# Patient Record
Sex: Female | Born: 2000 | Race: Black or African American | Hispanic: No | Marital: Single | Smoking: Never smoker
Health system: Southern US, Community
[De-identification: ages and names within clinical notes are randomized; demographics above are authoritative.]

## PROBLEM LIST (undated history)

## (undated) HISTORY — PX: DILATION AND CURETTAGE OF UTERUS: SHX78

---
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
## 2018-11-07 DIAGNOSIS — Z113 Encounter for screening for infections with a predominantly sexual mode of transmission: Secondary | ICD-10-CM | POA: Diagnosis not present

## 2018-11-07 DIAGNOSIS — O26849 Uterine size-date discrepancy, unspecified trimester: Secondary | ICD-10-CM | POA: Diagnosis not present

## 2018-11-07 DIAGNOSIS — Z3480 Encounter for supervision of other normal pregnancy, unspecified trimester: Secondary | ICD-10-CM | POA: Diagnosis not present

## 2018-11-07 DIAGNOSIS — Z3490 Encounter for supervision of normal pregnancy, unspecified, unspecified trimester: Secondary | ICD-10-CM | POA: Diagnosis not present

## 2018-11-07 DIAGNOSIS — J398 Other specified diseases of upper respiratory tract: Secondary | ICD-10-CM | POA: Insufficient documentation

## 2018-11-12 DIAGNOSIS — Z3490 Encounter for supervision of normal pregnancy, unspecified, unspecified trimester: Secondary | ICD-10-CM | POA: Insufficient documentation

## 2018-11-14 DIAGNOSIS — Z1379 Encounter for other screening for genetic and chromosomal anomalies: Secondary | ICD-10-CM | POA: Diagnosis not present

## 2018-12-03 DIAGNOSIS — Z3480 Encounter for supervision of other normal pregnancy, unspecified trimester: Secondary | ICD-10-CM | POA: Diagnosis not present

## 2018-12-25 DIAGNOSIS — Z3480 Encounter for supervision of other normal pregnancy, unspecified trimester: Secondary | ICD-10-CM | POA: Diagnosis not present

## 2018-12-25 DIAGNOSIS — A5609 Other chlamydial infection of lower genitourinary tract: Secondary | ICD-10-CM | POA: Diagnosis not present

## 2019-01-07 DIAGNOSIS — Z363 Encounter for antenatal screening for malformations: Secondary | ICD-10-CM | POA: Diagnosis not present

## 2019-01-07 DIAGNOSIS — O280 Abnormal hematological finding on antenatal screening of mother: Secondary | ICD-10-CM | POA: Diagnosis not present

## 2019-01-07 DIAGNOSIS — O283 Abnormal ultrasonic finding on antenatal screening of mother: Secondary | ICD-10-CM | POA: Diagnosis not present

## 2019-01-07 DIAGNOSIS — Z3A18 18 weeks gestation of pregnancy: Secondary | ICD-10-CM | POA: Diagnosis not present

## 2019-01-07 DIAGNOSIS — O289 Unspecified abnormal findings on antenatal screening of mother: Secondary | ICD-10-CM | POA: Diagnosis not present

## 2019-01-07 DIAGNOSIS — Z36 Encounter for antenatal screening for chromosomal anomalies: Secondary | ICD-10-CM | POA: Diagnosis not present

## 2019-01-15 DIAGNOSIS — O3502X0 Maternal care for (suspected) central nervous system malformation or damage in fetus, anencephaly, not applicable or unspecified: Secondary | ICD-10-CM | POA: Insufficient documentation

## 2019-01-15 DIAGNOSIS — Z3A18 18 weeks gestation of pregnancy: Secondary | ICD-10-CM | POA: Diagnosis not present

## 2019-01-15 DIAGNOSIS — Z3009 Encounter for other general counseling and advice on contraception: Secondary | ICD-10-CM | POA: Diagnosis not present

## 2019-01-15 DIAGNOSIS — O350XX0 Maternal care for (suspected) central nervous system malformation in fetus, not applicable or unspecified: Secondary | ICD-10-CM | POA: Diagnosis not present

## 2019-01-15 DIAGNOSIS — Z3A19 19 weeks gestation of pregnancy: Secondary | ICD-10-CM | POA: Diagnosis not present

## 2019-01-17 DIAGNOSIS — Z01812 Encounter for preprocedural laboratory examination: Secondary | ICD-10-CM | POA: Diagnosis not present

## 2019-01-17 DIAGNOSIS — Z20828 Contact with and (suspected) exposure to other viral communicable diseases: Secondary | ICD-10-CM | POA: Diagnosis not present

## 2019-01-17 DIAGNOSIS — O350XX0 Maternal care for (suspected) central nervous system malformation in fetus, not applicable or unspecified: Secondary | ICD-10-CM | POA: Diagnosis not present

## 2019-01-17 DIAGNOSIS — Z3A20 20 weeks gestation of pregnancy: Secondary | ICD-10-CM | POA: Diagnosis not present

## 2019-01-20 DIAGNOSIS — O350XX0 Maternal care for (suspected) central nervous system malformation in fetus, not applicable or unspecified: Secondary | ICD-10-CM | POA: Diagnosis not present

## 2019-01-20 DIAGNOSIS — Z3A20 20 weeks gestation of pregnancy: Secondary | ICD-10-CM | POA: Diagnosis not present

## 2019-01-21 DIAGNOSIS — Z3A20 20 weeks gestation of pregnancy: Secondary | ICD-10-CM | POA: Diagnosis not present

## 2019-01-21 DIAGNOSIS — O350XX0 Maternal care for (suspected) central nervous system malformation in fetus, not applicable or unspecified: Secondary | ICD-10-CM | POA: Diagnosis not present

## 2019-01-21 DIAGNOSIS — Z9889 Other specified postprocedural states: Secondary | ICD-10-CM | POA: Insufficient documentation

## 2019-01-21 DIAGNOSIS — Z3A18 18 weeks gestation of pregnancy: Secondary | ICD-10-CM | POA: Diagnosis not present

## 2019-02-03 DIAGNOSIS — Z48816 Encounter for surgical aftercare following surgery on the genitourinary system: Secondary | ICD-10-CM | POA: Diagnosis not present

## 2019-05-02 ENCOUNTER — Other Ambulatory Visit: Payer: Self-pay

## 2019-05-02 ENCOUNTER — Ambulatory Visit (HOSPITAL_COMMUNITY)
Admission: EM | Admit: 2019-05-02 | Discharge: 2019-05-02 | Disposition: A | Discharge status: Home / Self Care | Payer: Medicaid Other | Attending: Family Medicine | Admitting: Family Medicine

## 2019-05-02 ENCOUNTER — Encounter (HOSPITAL_COMMUNITY): Payer: Self-pay

## 2019-05-02 DIAGNOSIS — Z3201 Encounter for pregnancy test, result positive: Secondary | ICD-10-CM

## 2019-05-02 DIAGNOSIS — Z3490 Encounter for supervision of normal pregnancy, unspecified, unspecified trimester: Secondary | ICD-10-CM

## 2019-05-02 LAB — POC URINE PREG, ED: Preg Test, Ur: POSITIVE — AB

## 2019-05-02 LAB — POCT PREGNANCY, URINE: Preg Test, Ur: POSITIVE — AB

## 2019-05-02 NOTE — ED Provider Notes (Signed)
[place]    CSN: [id number] Arrival date & time: 05/02/19  1051      History   Chief Complaint Chief Complaint  Patient presents with  . Possible Pregnancy    HPI Cheyenne Medina is a 18 y.o. female.   HPI  Patient is here for pregnancy test.  She had 3 pregnancy test at home that were positive.  She would like for [place] to confirm this for her.  She is asking for a beta hCG.  She is having no abdominal pain or bleeding.  She does have concerned because her first pregnancy ended with spontaneous bleeding/abortion. I explained to her that it was not medically necessary to do a beta hCG.  If her prior urine pregnancy test is positive she will be sent to [facility] and followed.  History reviewed. No pertinent past medical history.  There are no problems to display for this patient.   History reviewed. No pertinent surgical history.  OB History    Gravida  1   Para      Term      Preterm      AB  1   Living  0     SAB      TAB      Ectopic      Multiple      Live Births               Home Medications    Prior to Admission medications   Medication Sig Start Date End Date Taking? Authorizing Provider  Prenat-FeAsp-Meth-FA-DHA w/o A (PRENATE PIXIE) 10-0.6-0.4-200 MG CAPS SMARTSIG:1 Capsule(s) By Mouth Daily    [provider]    Family History History reviewed. No pertinent family history.  Social History Social History   Tobacco Use  . Smoking status: Never Smoker  . Smokeless tobacco: Never Used  Substance Use Topics  . Alcohol use: No  . Drug use: Not on file     Allergies   Patient has no known allergies.   Review of Systems Review of Systems  Constitutional: Negative for chills and fever.  HENT: Negative for congestion and hearing loss.   Eyes: Negative for pain.  Respiratory: Negative for cough and shortness of breath.   Cardiovascular: Negative for chest pain and leg swelling.  Gastrointestinal: Negative  for abdominal pain, constipation and diarrhea.  Genitourinary: Negative for dysuria and frequency.       Amenorrhea  Musculoskeletal: Negative for myalgias.  Neurological: Negative for dizziness, seizures and headaches.  Psychiatric/Behavioral: The patient is not nervous/anxious.      Physical Exam Triage Vital Signs ED Triage Vitals  Enc Vitals Group     BP 05/02/19 1101 113/77     Pulse Rate 05/02/19 1101 68     Resp 05/02/19 1101 14     Temp 05/02/19 1101 98.1 F (36.7 C)     Temp Source 05/02/19 1101 Oral     SpO2 05/02/19 1101 99 %     Weight --      Height --      Head Circumference --      Peak Flow --      Pain Score 05/02/19 1058 0     Pain Loc --      Pain Edu? --      Excl. in [place]? --    No data found.  Updated Vital Signs BP 113/77 (BP Location: Left Arm)   Pulse 68   Temp 98.1 F (36.7  C) (Oral)   Resp 14   LMP 04/09/2019   SpO2 99%     Physical Exam Constitutional:      General: She is not in acute distress.    Appearance: She is well-developed.  HENT:     Head: Normocephalic and atraumatic.  Eyes:     Conjunctiva/sclera: Conjunctivae normal.     Pupils: Pupils are equal, round, and reactive to light.  Cardiovascular:     Rate and Rhythm: Normal rate.  Pulmonary:     Effort: Pulmonary effort is normal. No respiratory distress.  Abdominal:     General: There is no distension.     Palpations: Abdomen is soft.  Musculoskeletal:        General: Normal range of motion.     Cervical back: Normal range of motion.  Skin:    General: Skin is warm and dry.  Neurological:     Mental Status: She is alert.  Psychiatric:        Mood and Affect: Mood normal.        Behavior: Behavior normal.      UC Treatments / Results  Labs (all labs ordered are listed, but only abnormal results are displayed) Labs Reviewed  POCT PREGNANCY, URINE - Abnormal; Notable for the following components:      Result Value   Preg Test, Ur POSITIVE (*)    All other  components within normal limits  POC URINE PREG, ED - Abnormal; Notable for the following components:   Preg Test, Ur POSITIVE (*)    All other components within normal limits    EKG   Radiology No results found.  Procedures Procedures (including critical care time)  Medications Ordered in UC Medications - No data to display  Initial Impression / Assessment and Plan / UC Course  I have reviewed the triage vital signs and the nursing notes.  Pertinent labs & imaging results that were available during my care of the patient were reviewed by me and considered in my medical decision making (see chart for details).      Final Clinical Impressions(s) / UC Diagnoses   Final diagnoses:  Pregnancy, unspecified gestational age     Discharge Instructions     You are pregnant Follow up with OB No alcohol or smoking Continue prenatal vitamin   ED Prescriptions    None     PDMP not reviewed this encounter.   Eustace Moore, MD 05/02/19 1154

## 2019-05-02 NOTE — Discharge Instructions (Addendum)
You are pregnant Follow up with OB No alcohol or smoking Continue prenatal vitamin

## 2019-05-02 NOTE — ED Triage Notes (Signed)
Pt presents to the UC for pregnancy test. Pt reports she has 3 positive pregnancy test at home yesterday.

## 2019-05-12 DIAGNOSIS — Z3009 Encounter for other general counseling and advice on contraception: Secondary | ICD-10-CM | POA: Diagnosis not present

## 2019-05-12 DIAGNOSIS — Z3200 Encounter for pregnancy test, result unknown: Secondary | ICD-10-CM | POA: Diagnosis not present

## 2019-05-23 ENCOUNTER — Encounter (HOSPITAL_COMMUNITY): Payer: Self-pay | Admitting: Obstetrics and Gynecology

## 2019-05-23 ENCOUNTER — Inpatient Hospital Stay (HOSPITAL_COMMUNITY)
Admission: EM | Admit: 2019-05-23 | Discharge: 2019-05-23 | Disposition: A | Discharge status: Home / Self Care | Payer: Medicaid Other | Attending: Obstetrics and Gynecology | Admitting: Obstetrics and Gynecology

## 2019-05-23 ENCOUNTER — Other Ambulatory Visit: Payer: Self-pay

## 2019-05-23 ENCOUNTER — Inpatient Hospital Stay (HOSPITAL_COMMUNITY): Payer: Medicaid Other

## 2019-05-23 DIAGNOSIS — O418X10 Other specified disorders of amniotic fluid and membranes, first trimester, not applicable or unspecified: Secondary | ICD-10-CM | POA: Diagnosis not present

## 2019-05-23 DIAGNOSIS — O4690 Antepartum hemorrhage, unspecified, unspecified trimester: Secondary | ICD-10-CM

## 2019-05-23 DIAGNOSIS — O219 Vomiting of pregnancy, unspecified: Secondary | ICD-10-CM

## 2019-05-23 DIAGNOSIS — N939 Abnormal uterine and vaginal bleeding, unspecified: Secondary | ICD-10-CM | POA: Diagnosis present

## 2019-05-23 DIAGNOSIS — O468X1 Other antepartum hemorrhage, first trimester: Secondary | ICD-10-CM | POA: Diagnosis not present

## 2019-05-23 DIAGNOSIS — Z3A01 Less than 8 weeks gestation of pregnancy: Secondary | ICD-10-CM | POA: Diagnosis not present

## 2019-05-23 DIAGNOSIS — Z6790 Unspecified blood type, Rh positive: Secondary | ICD-10-CM | POA: Diagnosis not present

## 2019-05-23 DIAGNOSIS — O208 Other hemorrhage in early pregnancy: Secondary | ICD-10-CM | POA: Diagnosis not present

## 2019-05-23 LAB — COMPREHENSIVE METABOLIC PANEL
ALT: 21 U/L (ref 0–44)
AST: 23 U/L (ref 15–41)
Albumin: 4.1 g/dL (ref 3.5–5.0)
Alkaline Phosphatase: 44 U/L (ref 38–126)
Anion gap: 8 (ref 5–15)
BUN: 7 mg/dL (ref 6–20)
CO2: 24 mmol/L (ref 22–32)
Calcium: 9.5 mg/dL (ref 8.9–10.3)
Chloride: 105 mmol/L (ref 98–111)
Creatinine, Ser: 0.54 mg/dL (ref 0.44–1.00)
GFR calc Af Amer: >60 mL/min (ref >60)
GFR calc non Af Amer: >60 mL/min (ref >60)
Glucose, Bld: 98 mg/dL (ref 70–99)
Potassium: 3.8 mmol/L (ref 3.5–5.1)
Sodium: 137 mmol/L (ref 135–145)
Total Bilirubin: 1.6 mg/dL — ABNORMAL HIGH (ref 0.3–1.2)
Total Protein: 6.7 g/dL (ref 6.5–8.1)

## 2019-05-23 LAB — URINALYSIS, ROUTINE W REFLEX MICROSCOPIC
Bilirubin Urine: NEGATIVE
Glucose, UA: NEGATIVE mg/dL
Ketones, ur: NEGATIVE mg/dL
Leukocytes,Ua: NEGATIVE
Nitrite: NEGATIVE
Protein, ur: 30 mg/dL — AB
Specific Gravity, Urine: 1.026 (ref 1.005–1.030)
pH: 8 (ref 5.0–8.0)

## 2019-05-23 LAB — HCG, QUANTITATIVE, PREGNANCY: hCG, Beta Chain, Quant, S: 99684 m[IU]/mL — ABNORMAL HIGH (ref <5)

## 2019-05-23 LAB — CBC
HCT: 33.6 % — ABNORMAL LOW (ref 36.0–46.0)
Hemoglobin: 11.2 g/dL — ABNORMAL LOW (ref 12.0–15.0)
MCH: 28.8 pg (ref 26.0–34.0)
MCHC: 33.3 g/dL (ref 30.0–36.0)
MCV: 86.4 fL (ref 80.0–100.0)
Platelets: 201 10*3/uL (ref 150–400)
RBC: 3.89 MIL/uL (ref 3.87–5.11)
RDW: 15.7 % — ABNORMAL HIGH (ref 11.5–15.5)
WBC: 6.2 10*3/uL (ref 4.0–10.5)
nRBC: 0 % (ref 0.0–0.2)

## 2019-05-23 LAB — WET PREP, GENITAL
Sperm: NONE SEEN
Trich, Wet Prep: NONE SEEN
Yeast Wet Prep HPF POC: NONE SEEN

## 2019-05-23 LAB — ABO/RH: ABO/RH(D): A POS

## 2019-05-23 NOTE — MAU Provider Note (Signed)
History     CSN: [id number]  Arrival date and time: 05/23/19 1504   First Provider Initiated Contact with Patient 05/23/19 1744      Chief Complaint  Patient presents with  . Vaginal Bleeding   Cheyenne Medina is a 19 y.o. G2P0010 at [redacted]w[redacted]d who presents to MAU for vaginal bleeding which began yesterday. Pt reports yesterday it was light and it got heavier today and appeared in her underwear. Pt reports it is lighter than her period.  Pt also reports constant nausea and vomiting x2 yesterday, but denies vomiting apart from this.  Passing blood clots? no Blood soaking clothes? no Lightheaded/dizzy? no Significant pelvic pain or cramping? No, pt denies any pain Passed any tissue? no  Current pregnancy problems? Pt has not yet been seen Blood Type? unknown Allergies? NKDA Current medications? PNVs Current PNC & next appt? Nestor Ramp, 05/30/2019  Pt denies vaginal discharge/odor/itching. Pt denies abdominal pain, constipation, diarrhea, or urinary problems. Pt denies fever, chills, fatigue, sweating or changes in appetite. Pt denies SOB or chest pain. Pt denies dizziness, HA, light-headedness, weakness.   OB History    Gravida  2   Para      Term      Preterm      AB  1   Living  0     SAB      TAB      Ectopic      Multiple      Live Births              History reviewed. No pertinent past medical history.  Past Surgical History:  Procedure Laterality Date  . DILATION AND CURETTAGE OF UTERUS      History reviewed. No pertinent family history.  Social History   Tobacco Use  . Smoking status: Never Smoker  . Smokeless tobacco: Never Used  Substance Use Topics  . Alcohol use: Never  . Drug use: Never    Allergies: No Known Allergies  Medications Prior to Admission  Medication Sig Dispense Refill Last Dose  . Prenat-FeAsp-Meth-FA-DHA w/o A (PRENATE PIXIE) 10-0.6-0.4-200 MG CAPS SMARTSIG:1 Capsule(s) By Mouth Daily   05/23/2019 at  Unknown time    Review of Systems  Constitutional: Negative for chills, diaphoresis, fatigue and fever.  Eyes: Negative for visual disturbance.  Respiratory: Negative for shortness of breath.   Cardiovascular: Negative for chest pain.  Gastrointestinal: Positive for nausea and vomiting. Negative for abdominal pain, constipation and diarrhea.  Genitourinary: Positive for vaginal bleeding. Negative for dysuria, flank pain, frequency, pelvic pain, urgency and vaginal discharge.  Neurological: Negative for dizziness, weakness, light-headedness and headaches.   Physical Exam   Blood pressure 132/68, pulse 74, temperature 98.8 F (37.1 C), resp. rate 16, height 5' (1.524 m), weight 52.2 kg, last menstrual period 04/09/2019, SpO2 100 %.  Physical Exam  Constitutional: She is oriented to person, place, and time. She appears well-developed and well-nourished. No distress.  HENT:  Head: Normocephalic and atraumatic.  Respiratory: Effort normal.  GI: Soft.  Neurological: She is alert and oriented to person, place, and time.  Skin: Skin is warm and dry. She is not diaphoretic.  Psychiatric: She has a normal mood and affect. Her behavior is normal. Judgment and thought content normal.  Pt declines pelvic exam, blind swabs collected by RN.  Results for orders placed or performed during the hospital encounter of 05/23/19 (from the past 24 hour(s))  Urinalysis, Routine w reflex microscopic     Status:  Abnormal   Collection Time: 05/23/19  5:10 PM  Result Value Ref Range   Color, Urine YELLOW YELLOW   APPearance HAZY (A) CLEAR   Specific Gravity, Urine 1.026 1.005 - 1.030   pH 8.0 5.0 - 8.0   Glucose, UA NEGATIVE NEGATIVE mg/dL   Hgb urine dipstick MODERATE (A) NEGATIVE   Bilirubin Urine NEGATIVE NEGATIVE   Ketones, ur NEGATIVE NEGATIVE mg/dL   Protein, ur 30 (A) NEGATIVE mg/dL   Nitrite NEGATIVE NEGATIVE   Leukocytes,Ua NEGATIVE NEGATIVE   RBC / HPF 0-5 0 - 5 RBC/hpf   WBC, UA 0-5 0 - 5  WBC/hpf   Bacteria, UA FEW (A) NONE SEEN   Squamous Epithelial / LPF 11-20 0 - 5   Mucus PRESENT   CBC     Status: Abnormal   Collection Time: 05/23/19  5:48 PM  Result Value Ref Range   WBC 6.2 4.0 - 10.5 K/uL   RBC 3.89 3.87 - 5.11 MIL/uL   Hemoglobin 11.2 (L) 12.0 - 15.0 g/dL   HCT 16.1 (L) 09.6 - 04.5 %   MCV 86.4 80.0 - 100.0 fL   MCH 28.8 26.0 - 34.0 pg   MCHC 33.3 30.0 - 36.0 g/dL   RDW 40.9 (H) 81.1 - 91.4 %   Platelets 201 150 - 400 K/uL   nRBC 0.0 0.0 - 0.2 %  Comprehensive metabolic panel     Status: Abnormal   Collection Time: 05/23/19  5:48 PM  Result Value Ref Range   Sodium 137 135 - 145 mmol/L   Potassium 3.8 3.5 - 5.1 mmol/L   Chloride 105 98 - 111 mmol/L   CO2 24 22 - 32 mmol/L   Glucose, Bld 98 70 - 99 mg/dL   BUN 7 6 - 20 mg/dL   Creatinine, Ser 7.82 0.44 - 1.00 mg/dL   Calcium 9.5 8.9 - 95.6 mg/dL   Total Protein 6.7 6.5 - 8.1 g/dL   Albumin 4.1 3.5 - 5.0 g/dL   AST 23 15 - 41 U/L   ALT 21 0 - 44 U/L   Alkaline Phosphatase 44 38 - 126 U/L   Total Bilirubin 1.6 (H) 0.3 - 1.2 mg/dL   GFR calc non Af Amer >60 >60 mL/min   GFR calc Af Amer >60 >60 mL/min   Anion gap 8 5 - 15  hCG, quantitative, pregnancy     Status: Abnormal   Collection Time: 05/23/19  5:48 PM  Result Value Ref Range   hCG, Beta Chain, Quant, S 99,684 (H) <5 mIU/mL  ABO/Rh     Status: None   Collection Time: 05/23/19  5:48 PM  Result Value Ref Range   ABO/RH(D)      A POS Performed at [facility], [address] [place], [place] [address]   Wet prep, genital     Status: Abnormal   Collection Time: 05/23/19  6:00 PM  Result Value Ref Range   Yeast Wet Prep HPF POC NONE SEEN NONE SEEN   Trich, Wet Prep NONE SEEN NONE SEEN   Clue Cells Wet Prep HPF POC PRESENT (A) NONE SEEN   WBC, Wet Prep HPF POC MODERATE (A) NONE SEEN   Sperm NONE SEEN    US OB Comp Less 14 Wks  Result Date: 05/23/2019 CLINICAL DATA:  Bleeding abdominal pain EXAM: OBSTETRIC <14 WK ULTRASOUND  TECHNIQUE: Transabdominal ultrasound was performed for evaluation of the gestation as well as the maternal uterus and adnexal regions. COMPARISON:  None.  FINDINGS: Intrauterine gestational sac: Single Yolk sac:  Visualized. Embryo:  Visualized. Cardiac Activity: Visualized. Heart Rate: 117 bpm CRL: 5.8 mm   6 w 2 d                  [place] EDC: 01/14/2020 Subchorionic hemorrhage: There is a moderate volume of subchorionic hemorrhage Maternal uterus/adnexae: The ovaries are unremarkable. There is no significant free fluid in the pelvis. IMPRESSION: Single live IUP at 6 weeks and 2 days as above. There is a moderate volume of subchorionic hemorrhage. Electronically Signed   By: Constance Holster M.D.   On: 05/23/2019 18:29   MAU Course  Procedures  MDM -N/V, constant nausea, vomiting x2, will discuss OTC meds -r/o ectopic -UA: hazy/mod hgb/30PRO/few bacteria, sending urine for culture -CBC: WNL for pregnancy -CMP: WNL -[place]: single IUP, +FHR 117, [redacted]w[redacted]d, moderate subchorionic hemorrhage -hCG: 25,366 -ABO: A Positive -WetPrep: +ClueCells, isolated finding not requiring treatment -GC/CT collected -pt discharged to home in stable condition  Orders Placed This Encounter  Procedures  . Wet prep, genital    Standing Status:   Standing    Number of Occurrences:   1  . Culture, OB Urine    Standing Status:   Standing    Number of Occurrences:   1  . US OB Comp Less 14 Wks    Standing Status:   Standing    Number of Occurrences:   1    Order Specific Question:   Symptom/Reason for Exam    Answer:   Vaginal bleeding in pregnancy [[id number]]  . Urinalysis, Routine w reflex microscopic    Standing Status:   Standing    Number of Occurrences:   1  . CBC    Standing Status:   Standing    Number of Occurrences:   1  . Comprehensive metabolic panel    Standing Status:   Standing    Number of Occurrences:   1  . hCG, quantitative, pregnancy    Standing Status:   Standing    Number of Occurrences:   1  .  ABO/Rh    Standing Status:   Standing    Number of Occurrences:   1  . Discharge patient    Order Specific Question:   Discharge disposition    Answer:   01-Home or Self Care [1]    Order Specific Question:   Discharge patient date    Answer:   05/23/2019   Assessment and Plan   1. Subchorionic hemorrhage of placenta in first trimester, single or unspecified fetus   2. Vaginal bleeding in pregnancy   3. Blood type, Rh positive   4. [redacted] weeks gestation of pregnancy   5. Nausea and vomiting in pregnancy    Allergies as of 05/23/2019   No Known Allergies     Medication List    TAKE these medications   Prenate Pixie 10-0.6-0.4-200 MG Caps SMARTSIG:1 Capsule(s) By Mouth Daily      -will call with culture results, if positive -safe meds in pregnancy list given with focus on meds for N/V, VitB6 and Unisom recommended -discussed implications of subchorionic hemorrhage, normal expectations and when to return to MAU -discussed nonpharmacologic and pharmacologic treatments of N/V -discussed normal expectations for N/V in pregnancy -strict bleeding/pain/hyperemesis/return MAU precautions given -pt discharged to home in stable condition  Elmyra Ricks E Lemar Bakos 05/23/2019, 7:05 PM

## 2019-05-23 NOTE — MAU Note (Signed)
.   Cheyenne Medina is a 19 y.o. at [redacted]w[redacted]d here in MAU reporting: vaginal bleeding x 2 days  Denies any pain. LMP: 04/09/19 Onset of complaint: 2 days Pain score: 0 Vitals:   05/23/19 1508 05/23/19 1647  BP: 132/77 132/68  Pulse: 81 74  Resp: 16 16  Temp: 98.8 F (37.1 C) 98.8 F (37.1 C)  SpO2: 100% 100%     FHT: Lab orders placed from triage  UA

## 2019-05-23 NOTE — L&D Delivery Note (Signed)
Delivery Note Patient pushed well for 20 minutes.   At 3:48 PM a viable female was delivered via  (Presentation:   Occiput Anterior).  There was a tight nuchal cord that was unable to be reduced prior to delivery.  APGAR: 5, 9; weight 5 lb 12.1 oz (2610 g).   Placenta status: Spontaneous, Intact.  Cord: 3 vessels with the following complications: None.  Cord pH: n/a  Anesthesia: Epidural Episiotomy: None Lacerations: None Suture Repair: n/a Est. Blood Loss (mL): 50  Mom to postpartum.  Baby to Couplet care / Skin to Skin.  Conya Ellinwood GEFFEL Caron Ode 12/22/2019, 4:19 PM

## 2019-05-23 NOTE — Discharge Instructions (Signed)
Miscarriage A miscarriage is the loss of an unborn baby (fetus) before the 20th week of pregnancy. Most miscarriages happen during the first 3 months of pregnancy. Sometimes, a miscarriage can happen before a woman knows that she is pregnant. Having a miscarriage can be an emotional experience. If you have had a miscarriage, talk with your health care provider about any questions you may have about miscarrying, the grieving process, and your plans for future pregnancy. What are the causes? A miscarriage may be caused by:  Problems with the genes or chromosomes of the fetus. These problems make it impossible for the baby to develop normally. They are often the result of random errors that occur early in the development of the baby, and are not passed from parent to child (not inherited).  Infection of the cervix or uterus.  Conditions that affect hormone balance in the body.  Problems with the cervix, such as the cervix opening and thinning before pregnancy is at term (cervical insufficiency).  Problems with the uterus. These may include: ? A uterus with an abnormal shape. ? Fibroids in the uterus. ? Congenital abnormalities. These are problems that were present at birth.  Certain medical conditions.  Smoking, drinking alcohol, or using drugs.  Injury (trauma). In many cases, the cause of a miscarriage is not known. What are the signs or symptoms? Symptoms of this condition include:  Vaginal bleeding or spotting, with or without cramps or pain.  Pain or cramping in the abdomen or lower back.  Passing fluid, tissue, or blood clots from the vagina. How is this diagnosed? This condition may be diagnosed based on:  A physical exam.  Ultrasound.  Blood tests.  Urine tests. How is this treated? Treatment for a miscarriage is sometimes not necessary if you naturally pass all the tissue that was in your uterus. If necessary, this condition may be treated with:  Dilation and  curettage (D&C). This is a procedure in which the cervix is stretched open and the lining of the uterus (endometrium) is scraped. This is done only if tissue from the fetus or placenta remains in the body (incomplete miscarriage).  Medicines, such as: ? Antibiotic medicine, to treat infection. ? Medicine to help the body pass any remaining tissue. ? Medicine to reduce (contract) the size of the uterus. These medicines may be given if you have a lot of bleeding. If you have Rh negative blood and your baby was Rh positive, you will need a shot of a medicine called Rh immunoglobulinto protect your future babies from Rh blood problems. "Rh-negative" and "Rh-positive" refer to whether or not the blood has a specific protein found on the surface of red blood cells (Rh factor). Follow these instructions at home: Medicines   Take over-the-counter and prescription medicines only as told by your health care provider.  If you were prescribed antibiotic medicine, take it as told by your health care provider. Do not stop taking the antibiotic even if you start to feel better.  Do not take NSAIDs, such as aspirin and ibuprofen, unless they are approved by your health care provider. These medicines can cause bleeding. Activity  Rest as directed. Ask your health care provider what activities are safe for you.  Have someone help with home and family responsibilities during this time. General instructions  Keep track of the number of sanitary pads you use each day and how soaked (saturated) they are. Write down this information.  Monitor the amount of tissue or blood clots that   you pass from your vagina. Save any large amounts of tissue for your health care provider to examine.  Do not use tampons, douche, or have sex until your health care provider approves.  To help you and your partner with the process of grieving, talk with your health care provider or seek counseling.  When you are ready, meet with  your health care provider to discuss any important steps you should take for your health. Also, discuss steps you should take to have a healthy pregnancy in the future.  Keep all follow-up visits as told by your health care provider. This is important. Where to find more information  The American Congress of Obstetricians and Gynecologists: www.acog.org  [place] Department of Health and Cytogeneticist of Women's Health: http://hoffman.com/ Contact a health care provider if:  You have a fever or chills.  You have a foul smelling vaginal discharge.  You have more bleeding instead of less. Get help right away if:  You have severe cramps or pain in your back or abdomen.  You pass blood clots or tissue from your vagina that is walnut-sized or larger.  You soak more than 1 regular sanitary pad in an hour.  You become light-headed or weak.  You pass out.  You have feelings of sadness that take over your thoughts, or you have thoughts of hurting yourself. Summary  Most miscarriages happen in the first 3 months of pregnancy. Sometimes miscarriage happens before a woman even knows that she is pregnant.  Follow your health care provider's instruction for home care. Keep all follow-up appointments.  To help you and your partner with the process of grieving, talk with your health care provider or seek counseling. This information is not intended to replace advice given to you by your health care provider. Make sure you discuss any questions you have with your health care provider. Document Revised: 08/30/2018 Document Reviewed: 06/13/2016 Elsevier Patient Education  2020 [employer].                  Safe Medications in Pregnancy    Acne: Benzoyl Peroxide Salicylic Acid  Backache/Headache: Tylenol: 2 regular strength every 4 hours OR              2 Extra strength every 6 hours  Colds/Coughs/Allergies: Benadryl (alcohol free) 25 mg every 6 hours as needed Breath right  strips Claritin Cepacol throat lozenges Chloraseptic throat spray Cold-Eeze- up to three times per day Cough drops, alcohol free Flonase (by prescription only) Guaifenesin Mucinex Robitussin DM (plain only, alcohol free) Saline nasal spray/drops Sudafed (pseudoephedrine) & Actifed ** use only after [redacted] weeks gestation and if you do not have high blood pressure Tylenol Vicks Vaporub Zinc lozenges Zyrtec   Constipation: Colace Ducolax suppositories Fleet enema Glycerin suppositories Metamucil Milk of magnesia Miralax Senokot Smooth move tea  Diarrhea: Kaopectate Imodium A-D  *NO pepto Bismol  Hemorrhoids: Anusol Anusol HC Preparation H Tucks  Indigestion: Tums Maalox Mylanta Zantac  Pepcid  Insomnia: Benadryl (alcohol free) 25mg  every 6 hours as needed Tylenol PM Unisom, no Gelcaps  Leg Cramps: Tums MagGel  Nausea/Vomiting:  Bonine Dramamine Emetrol Ginger extract Sea bands Meclizine  Nausea medication to take during pregnancy:  Unisom (doxylamine succinate 25 mg tablets) Take one tablet daily at bedtime. If symptoms are not adequately controlled, the dose can be increased to a maximum recommended dose of two tablets daily (1/2 tablet in the morning, 1/2 tablet mid-afternoon and one at bedtime). Vitamin B6 100mg  tablets.  Take one tablet twice a day (up to 200 mg per day).  Skin Rashes: Aveeno products Benadryl cream or 25mg  every 6 hours as needed Calamine Lotion 1% cortisone cream  Yeast infection: Gyne-lotrimin 7 Monistat 7   **If taking multiple medications, please check labels to avoid duplicating the same active ingredients **take medication as directed on the label ** Do not exceed 4000 mg of tylenol in 24 hours **Do not take medications that contain aspirin or ibuprofen    Hyperemesis Gravidarum Hyperemesis gravidarum is a severe form of nausea and vomiting that happens during pregnancy. Hyperemesis is worse than morning  sickness. It may cause you to have nausea or vomiting all day for many days. It may keep you from eating and drinking enough food and liquids, which can lead to dehydration, malnutrition, and weight loss. Hyperemesis usually occurs during the first half (the first 20 weeks) of pregnancy. It often goes away once a woman is in her second half of pregnancy. However, sometimes hyperemesis continues through an entire pregnancy. What are the causes? The cause of this condition is not known. It may be related to changes in chemicals (hormones) in the body during pregnancy, such as the high level of pregnancy hormone (human chorionic gonadotropin) or the increase in the female sex hormone (estrogen). What are the signs or symptoms? Symptoms of this condition include:  Nausea that does not go away.  Vomiting that does not allow you to keep any food down.  Weight loss.  Body fluid loss (dehydration).  Having no desire to eat, or not liking food that you have previously enjoyed. How is this diagnosed? This condition may be diagnosed based on:  A physical exam.  Your medical history.  Your symptoms.  Blood tests.  Urine tests. How is this treated? This condition is managed by controlling symptoms. This may include:  Following an eating plan. This can help lessen nausea and vomiting.  Taking prescription medicines. An eating plan and medicines are often used together to help control symptoms. If medicines do not help relieve nausea and vomiting, you may need to receive fluids through an IV at the hospital. Follow these instructions at home: Eating and drinking   Avoid the following: ? Drinking fluids with meals. Try not to drink anything during the 30 minutes before and after your meals. ? Drinking more than 1 cup of fluid at a time. ? Eating foods that trigger your symptoms. These may include spicy foods, coffee, high-fat foods, very sweet foods, and acidic foods. ? Skipping meals.  Nausea can be more intense on an empty stomach. If you cannot tolerate food, do not force it. Try sucking on ice chips or other frozen items and make up for missed calories later. ? Lying down within 2 hours after eating. ? Being exposed to environmental triggers. These may include food smells, smoky rooms, closed spaces, rooms with strong smells, warm or humid places, overly loud and noisy rooms, and rooms with motion or flickering lights. Try eating meals in a well-ventilated area that is free of strong smells. ? Quick and sudden changes in your movement. ? Taking iron pills and multivitamins that contain iron. If you take prescription iron pills, do not stop taking them unless your health care provider approves. ? Preparing food. The smell of food can spoil your appetite or trigger nausea.  To help relieve your symptoms: ? Listen to your body. Everyone is different and has different preferences. Find what works best for you. ? Eat  and drink slowly. ? Eat 5-6 small meals daily instead of 3 large meals. Eating small meals and snacks can help you avoid an empty stomach. ? In the morning, before getting out of bed, eat a couple of crackers to avoid moving around on an empty stomach. ? Try eating starchy foods as these are usually tolerated well. Examples include cereal, toast, bread, potatoes, pasta, rice, and pretzels. ? Include at least 1 serving of protein with your meals and snacks. Protein options include lean meats, poultry, seafood, beans, nuts, nut butters, eggs, cheese, and yogurt. ? Try eating a protein-rich snack before bed. Examples of a protein-rick snack include cheese and crackers or a peanut butter sandwich made with 1 slice of whole-wheat bread and 1 tsp (5 g) of peanut butter. ? Eat or suck on things that have ginger in them. It may help relieve nausea. Add  tsp ground ginger to hot tea or choose ginger tea. ? Try drinking 100% fruit juice or an electrolyte drink. An electrolyte  drink contains sodium, potassium, and chloride. ? Drink fluids that are cold, clear, and carbonated or sour. Examples include lemonade, ginger ale, lemon-lime soda, ice water, and sparkling water. ? Brush your teeth or use a mouth rinse after meals. ? Talk with your health care provider about starting a supplement of vitamin B6. General instructions  Take over-the-counter and prescription medicines only as told by your health care provider.  Follow instructions from your health care provider about eating or drinking restrictions.  Continue to take your prenatal vitamins as told by your health care provider. If you are having trouble taking your prenatal vitamins, talk with your health care provider about different options.  Keep all follow-up and pre-birth (prenatal) visits as told by your health care provider. This is important. Contact a health care provider if:  You have pain in your abdomen.  You have a severe headache.  You have vision problems.  You are losing weight.  You feel weak or dizzy. Get help right away if:  You cannot drink fluids without vomiting.  You vomit blood.  You have constant nausea and vomiting.  You are very weak.  You faint.  You have a fever and your symptoms suddenly get worse. Summary  Hyperemesis gravidarum is a severe form of nausea and vomiting that happens during pregnancy.  Making some changes to your eating habits may help relieve nausea and vomiting.  This condition may be managed with medicine.  If medicines do not help relieve nausea and vomiting, you may need to receive fluids through an IV at the hospital. This information is not intended to replace advice given to you by your health care provider. Make sure you discuss any questions you have with your health care provider. Document Revised: 05/28/2017 Document Reviewed: 01/05/2016 Elsevier Patient Education  [place]. Morning Sickness  Morning sickness is when a  woman feels nauseous during pregnancy. This nauseous feeling may or may not come with vomiting. It often occurs in the morning, but it can be a problem at any time of day. Morning sickness is most common during the first trimester. In some cases, it may continue throughout pregnancy. Although morning sickness is unpleasant, it is usually harmless unless the woman develops severe and continual vomiting (hyperemesis gravidarum), a condition that requires more intense treatment. What are the causes? The exact cause of this condition is not known, but it seems to be related to normal hormonal changes that occur in pregnancy. What increases  the risk? You are more likely to develop this condition if:  You experienced nausea or vomiting before your pregnancy.  You had morning sickness during a previous pregnancy.  You are pregnant with more than one baby, such as twins. What are the signs or symptoms? Symptoms of this condition include:  Nausea.  Vomiting. How is this diagnosed? This condition is usually diagnosed based on your signs and symptoms. How is this treated? In many cases, treatment is not needed for this condition. Making some changes to what you eat may help to control symptoms. Your health care provider may also prescribe or recommend:  Vitamin B6 supplements.  Anti-nausea medicines.  Ginger. Follow these instructions at home: Medicines  Take over-the-counter and prescription medicines only as told by your health care provider. Do not use any prescription, over-the-counter, or herbal medicines for morning sickness without first talking with your health care provider.  Taking multivitamins before getting pregnant can prevent or decrease the severity of morning sickness in most women. Eating and drinking  Eat a piece of dry toast or crackers before getting out of bed in the morning.  Eat 5 or 6 small meals a day.  Eat dry and bland foods, such as rice or a baked potato.  Foods that are high in carbohydrates are often helpful.  Avoid greasy, fatty, and spicy foods.  Have someone cook for you if the smell of any food causes nausea and vomiting.  If you feel nauseous after taking prenatal vitamins, take the vitamins at night or with a snack.  Snack on protein foods between meals if you are hungry. Nuts, yogurt, and cheese are good options.  Drink fluids throughout the day.  Try ginger ale made with real ginger, ginger tea made from fresh grated ginger, or ginger candies. General instructions  Do not use any products that contain nicotine or tobacco, such as cigarettes and e-cigarettes. If you need help quitting, ask your health care provider.  Get an air purifier to keep the air in your house free of odors.  Get plenty of fresh air.  Try to avoid odors that trigger your nausea.  Consider trying these methods to help relieve symptoms: ? Wearing an acupressure wristband. These wristbands are often worn for seasickness. ? Acupuncture. Contact a health care provider if:  Your home remedies are not working and you need medicine.  You feel dizzy or light-headed.  You are losing weight. Get help right away if:  You have persistent and uncontrolled nausea and vomiting.  You faint.  You have severe pain in your abdomen. Summary  Morning sickness is when a woman feels nauseous during pregnancy. This nauseous feeling may or may not come with vomiting.  Morning sickness is most common during the first trimester.  It often occurs in the morning, but it can be a problem at any time of day.  In many cases, treatment is not needed for this condition. Making some changes to what you eat may help to control symptoms. This information is not intended to replace advice given to you by your health care provider. Make sure you discuss any questions you have with your health care provider. Document Revised: 04/20/2017 Document Reviewed: 06/10/2016 Elsevier  Patient Education  2020 [employer] Subchorionic Hematoma  A subchorionic hematoma is a gathering of blood between the outer wall of the embryo (chorion) and the inner wall of the womb (uterus). This condition can cause vaginal bleeding. If they cause little or no vaginal bleeding, early  small hematomas usually shrink on their own and do not affect your baby or pregnancy. When bleeding starts later in pregnancy, or if the hematoma is larger or occurs in older pregnant women, the condition may be more serious. Larger hematomas may get bigger, which increases the chances of miscarriage. This condition also increases the risk of:  Premature separation of the placenta from the uterus.  Premature (preterm) labor.  Stillbirth. What are the causes? The exact cause of this condition is not known. It occurs when blood is trapped between the placenta and the uterine wall because the placenta has separated from the original site of implantation. What increases the risk? You are more likely to develop this condition if:  You were treated with fertility medicines.  You conceived through in vitro fertilization (IVF). What are the signs or symptoms? Symptoms of this condition include:  Vaginal spotting or bleeding.  Contractions of the uterus. These cause abdominal pain. Sometimes you may have no symptoms and the bleeding may only be seen when ultrasound images are taken (transvaginal ultrasound). How is this diagnosed? This condition is diagnosed based on a physical exam. This includes a pelvic exam. You may also have other tests, including:  Blood tests.  Urine tests.  Ultrasound of the abdomen. How is this treated? Treatment for this condition can vary. Treatment may include:  Watchful waiting. You will be monitored closely for any changes in bleeding. During this stage: ? The hematoma may be reabsorbed by the body. ? The hematoma may separate the fluid-filled space containing the  embryo (gestational sac) from the wall of the womb (endometrium).  Medicines.  Activity restriction. This may be needed until the bleeding stops. Follow these instructions at home:  Stay on bed rest if told to do so by your health care provider.  Do not lift anything that is heavier than 10 lbs. (4.5 kg) or as told by your health care provider.  Do not use any products that contain nicotine or tobacco, such as cigarettes and e-cigarettes. If you need help quitting, ask your health care provider.  Track and write down the number of pads you use each day and how soaked (saturated) they are.  Do not use tampons.  Keep all follow-up visits as told by your health care provider. This is important. Your health care provider may ask you to have follow-up blood tests or ultrasound tests or both. Contact a health care provider if:  You have any vaginal bleeding.  You have a fever. Get help right away if:  You have severe cramps in your stomach, back, abdomen, or pelvis.  You pass large clots or tissue. Save any tissue for your health care provider to look at.  You have more vaginal bleeding, and you faint or become lightheaded or weak. Summary  A subchorionic hematoma is a gathering of blood between the outer wall of the placenta and the uterus.  This condition can cause vaginal bleeding.  Sometimes you may have no symptoms and the bleeding may only be seen when ultrasound images are taken.  Treatment may include watchful waiting, medicines, or activity restriction. This information is not intended to replace advice given to you by your health care provider. Make sure you discuss any questions you have with your health care provider. Document Revised: 04/20/2017 Document Reviewed: 07/04/2016 Elsevier Patient Education  2020 [employer] Vaginal Bleeding During Pregnancy, First Trimester  A small amount of bleeding from the vagina (spotting) is relatively common during early  pregnancy.  It usually stops on its own. Various things may cause bleeding or spotting during early pregnancy. Some bleeding may be related to the pregnancy, and some may not. In many cases, the bleeding is normal and is not a problem. However, bleeding can also be a sign of something serious. Be sure to tell your health care provider about any vaginal bleeding right away. Some possible causes of vaginal bleeding during the first trimester include:  Infection or inflammation of the cervix.  Growths (polyps) on the cervix.  Miscarriage or threatened miscarriage.  Pregnancy tissue developing outside of the uterus (ectopic pregnancy).  A mass of tissue developing in the uterus due to an egg being fertilized incorrectly (molar pregnancy). Follow these instructions at home: Activity  Follow instructions from your health care provider about limiting your activity. Ask what activities are safe for you.  If needed, make plans for someone to help with your regular activities.  Do not have sex or orgasms until your health care provider says that this is safe. General instructions  Take over-the-counter and prescription medicines only as told by your health care provider.  Pay attention to any changes in your symptoms.  Do not use tampons or douche.  Write down how many pads you use each day, how often you change pads, and how soaked (saturated) they are.  If you pass any tissue from your vagina, save the tissue so you can show it to your health care provider.  Keep all follow-up visits as told by your health care provider. This is important. Contact a health care provider if:  You have vaginal bleeding during any part of your pregnancy.  You have cramps or labor pains.  You have a fever. Get help right away if:  You have severe cramps in your back or abdomen.  You pass large clots or a large amount of tissue from your vagina.  Your bleeding increases.  You feel light-headed or  weak, or you faint.  You have chills.  You are leaking fluid or have a gush of fluid from your vagina. Summary  A small amount of bleeding (spotting) from the vagina is relatively common during early pregnancy.  Various things may cause bleeding or spotting in early pregnancy.  Be sure to tell your health care provider about any vaginal bleeding right away. This information is not intended to replace advice given to you by your health care provider. Make sure you discuss any questions you have with your health care provider. Document Revised: 08/27/2018 Document Reviewed: 08/10/2016 Elsevier Patient Education  2020 [employer].

## 2019-05-24 LAB — CULTURE, OB URINE: Culture: NO GROWTH

## 2019-05-26 LAB — GC/CHLAMYDIA PROBE AMP (~~LOC~~) NOT AT ARMC
Chlamydia: NEGATIVE
Comment: NEGATIVE
Comment: NORMAL
Neisseria Gonorrhea: NEGATIVE

## 2019-06-03 DIAGNOSIS — O26849 Uterine size-date discrepancy, unspecified trimester: Secondary | ICD-10-CM | POA: Diagnosis not present

## 2019-06-03 DIAGNOSIS — Z113 Encounter for screening for infections with a predominantly sexual mode of transmission: Secondary | ICD-10-CM | POA: Diagnosis not present

## 2019-06-18 DIAGNOSIS — Z3480 Encounter for supervision of other normal pregnancy, unspecified trimester: Secondary | ICD-10-CM | POA: Diagnosis not present

## 2019-06-19 DIAGNOSIS — Z3A10 10 weeks gestation of pregnancy: Secondary | ICD-10-CM | POA: Diagnosis not present

## 2019-06-19 DIAGNOSIS — O352XX0 Maternal care for (suspected) hereditary disease in fetus, not applicable or unspecified: Secondary | ICD-10-CM | POA: Diagnosis not present

## 2019-07-07 DIAGNOSIS — Z3A12 12 weeks gestation of pregnancy: Secondary | ICD-10-CM | POA: Diagnosis not present

## 2019-07-07 DIAGNOSIS — O352XX0 Maternal care for (suspected) hereditary disease in fetus, not applicable or unspecified: Secondary | ICD-10-CM | POA: Diagnosis not present

## 2019-07-07 DIAGNOSIS — Z3689 Encounter for other specified antenatal screening: Secondary | ICD-10-CM | POA: Diagnosis not present

## 2019-07-30 DIAGNOSIS — Z3482 Encounter for supervision of other normal pregnancy, second trimester: Secondary | ICD-10-CM | POA: Diagnosis not present

## 2019-08-18 DIAGNOSIS — Z363 Encounter for antenatal screening for malformations: Secondary | ICD-10-CM | POA: Diagnosis not present

## 2019-08-18 DIAGNOSIS — O09292 Supervision of pregnancy with other poor reproductive or obstetric history, second trimester: Secondary | ICD-10-CM | POA: Diagnosis not present

## 2019-08-18 DIAGNOSIS — Z36 Encounter for antenatal screening for chromosomal anomalies: Secondary | ICD-10-CM | POA: Diagnosis not present

## 2019-08-18 DIAGNOSIS — Z3A18 18 weeks gestation of pregnancy: Secondary | ICD-10-CM | POA: Diagnosis not present

## 2019-10-24 DIAGNOSIS — Z23 Encounter for immunization: Secondary | ICD-10-CM | POA: Diagnosis not present

## 2019-10-24 DIAGNOSIS — Z3480 Encounter for supervision of other normal pregnancy, unspecified trimester: Secondary | ICD-10-CM | POA: Diagnosis not present

## 2019-10-24 DIAGNOSIS — O99019 Anemia complicating pregnancy, unspecified trimester: Secondary | ICD-10-CM | POA: Diagnosis not present

## 2019-11-10 ENCOUNTER — Encounter (HOSPITAL_COMMUNITY): Payer: Self-pay | Admitting: Obstetrics and Gynecology

## 2019-11-10 ENCOUNTER — Inpatient Hospital Stay (HOSPITAL_COMMUNITY)
Admission: AD | Admit: 2019-11-10 | Discharge: 2019-11-10 | Disposition: A | Discharge status: Home / Self Care | Payer: Medicaid Other | Attending: Obstetrics and Gynecology | Admitting: Obstetrics and Gynecology

## 2019-11-10 ENCOUNTER — Other Ambulatory Visit: Payer: Self-pay

## 2019-11-10 DIAGNOSIS — O26893 Other specified pregnancy related conditions, third trimester: Secondary | ICD-10-CM | POA: Diagnosis not present

## 2019-11-10 DIAGNOSIS — R531 Weakness: Secondary | ICD-10-CM

## 2019-11-10 DIAGNOSIS — Z3689 Encounter for other specified antenatal screening: Secondary | ICD-10-CM

## 2019-11-10 DIAGNOSIS — R42 Dizziness and giddiness: Secondary | ICD-10-CM

## 2019-11-10 DIAGNOSIS — R102 Pelvic and perineal pain: Secondary | ICD-10-CM

## 2019-11-10 DIAGNOSIS — R110 Nausea: Secondary | ICD-10-CM

## 2019-11-10 DIAGNOSIS — Z3A30 30 weeks gestation of pregnancy: Secondary | ICD-10-CM | POA: Insufficient documentation

## 2019-11-10 DIAGNOSIS — O212 Late vomiting of pregnancy: Secondary | ICD-10-CM | POA: Insufficient documentation

## 2019-11-10 LAB — URINALYSIS, ROUTINE W REFLEX MICROSCOPIC
Bilirubin Urine: NEGATIVE
Glucose, UA: NEGATIVE mg/dL
Hgb urine dipstick: NEGATIVE
Ketones, ur: NEGATIVE mg/dL
Nitrite: NEGATIVE
Protein, ur: NEGATIVE mg/dL
Specific Gravity, Urine: 1.017 (ref 1.005–1.030)
pH: 6 (ref 5.0–8.0)

## 2019-11-10 LAB — COMPREHENSIVE METABOLIC PANEL
ALT: 16 U/L (ref 0–44)
AST: 32 U/L (ref 15–41)
Albumin: 2.9 g/dL — ABNORMAL LOW (ref 3.5–5.0)
Alkaline Phosphatase: 62 U/L (ref 38–126)
Anion gap: 9 (ref 5–15)
BUN: <5 mg/dL — ABNORMAL LOW (ref 6–20)
CO2: 21 mmol/L — ABNORMAL LOW (ref 22–32)
Calcium: 9.1 mg/dL (ref 8.9–10.3)
Chloride: 106 mmol/L (ref 98–111)
Creatinine, Ser: 0.43 mg/dL — ABNORMAL LOW (ref 0.44–1.00)
GFR calc Af Amer: >60 mL/min (ref >60)
GFR calc non Af Amer: >60 mL/min (ref >60)
Glucose, Bld: 88 mg/dL (ref 70–99)
Potassium: 4.1 mmol/L (ref 3.5–5.1)
Sodium: 136 mmol/L (ref 135–145)
Total Bilirubin: 0.4 mg/dL (ref 0.3–1.2)
Total Protein: 5.8 g/dL — ABNORMAL LOW (ref 6.5–8.1)

## 2019-11-10 LAB — CBC
HCT: 30.1 % — ABNORMAL LOW (ref 36.0–46.0)
Hemoglobin: 9.7 g/dL — ABNORMAL LOW (ref 12.0–15.0)
MCH: 29.7 pg (ref 26.0–34.0)
MCHC: 32.2 g/dL (ref 30.0–36.0)
MCV: 92 fL (ref 80.0–100.0)
Platelets: 142 10*3/uL — ABNORMAL LOW (ref 150–400)
RBC: 3.27 MIL/uL — ABNORMAL LOW (ref 3.87–5.11)
RDW: 13.3 % (ref 11.5–15.5)
WBC: 8.4 10*3/uL (ref 4.0–10.5)
nRBC: 0 % (ref 0.0–0.2)

## 2019-11-10 MED ORDER — LACTATED RINGERS IV BOLUS
1000.0000 mL | Freq: Once | INTRAVENOUS | Status: AC
  Administered 2019-11-10: 1000 mL via INTRAVENOUS

## 2019-11-10 MED ORDER — ACETAMINOPHEN 500 MG PO TABS
1000.0000 mg | ORAL_TABLET | Freq: Once | ORAL | Status: DC
  Filled 2019-11-10: qty 2

## 2019-11-10 NOTE — MAU Provider Note (Signed)
History     CSN: [id number]  Arrival date and time: 11/10/19 1335   First Provider Initiated Contact with Patient 11/10/19 1413      Chief Complaint  Patient presents with  . Nausea  . Dizzy   HPI Cheyenne Medina is a 19 y.o. G2P0010 at [redacted]w[redacted]d who presents to MAU with chief complaint of dizziness, nausea and weakness, new onset today. 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 She experienced the aforementioned symptoms, which resolved when she was given a place to rest and juice. Patient denies syncope or history of glucose issues. She states she has only had cookies and juice to eat today.  She denies vaginal bleeding, leaking of fluid, decreased fetal movement, fever, falls, or recent illness.   OB History    Gravida  2   Para      Term      Preterm      AB  1   Living  0     SAB      TAB      Ectopic      Multiple      Live Births              History reviewed. No pertinent past medical history.  Past Surgical History:  Procedure Laterality Date  . DILATION AND CURETTAGE OF UTERUS      History reviewed. No pertinent family history.  Social History   Tobacco Use  . Smoking status: Never Smoker  . Smokeless tobacco: Never Used  Vaping Use  . Vaping Use: Never used  Substance Use Topics  . Alcohol use: Never  . Drug use: Never    Allergies: No Known Allergies  Medications Prior to Admission  Medication Sig Dispense Refill Last Dose  . Prenat-FeAsp-Meth-FA-DHA w/o A (PRENATE PIXIE) 10-0.6-0.4-200 MG CAPS SMARTSIG:1 Capsule(s) By Mouth Daily   11/10/2019 at Unknown time    Review of Systems  Constitutional: Positive for fatigue.  Gastrointestinal: Negative for abdominal pain.  Genitourinary: Negative for vaginal bleeding.  Musculoskeletal: Negative for back pain.  Neurological: Positive for dizziness and light-headedness.  All other systems reviewed and are negative.  Physical Exam   Blood  pressure 99/69, pulse 93, temperature 99.1 F (37.3 C), temperature source Oral, resp. rate 18, height 5' (1.524 m), weight 58.8 kg, last menstrual period 04/09/2019, SpO2 98 %.  Physical Exam  Nursing note and vitals reviewed. Constitutional:  Non-toxic appearance. She does not appear ill.  HENT:  Mouth/Throat: Mucous membranes are moist. Oropharynx is clear.  Cardiovascular: Normal rate.  Respiratory: Effort normal and breath sounds normal.  GI: Normal appearance. There is no abdominal tenderness.  Gravid  Neurological: She is alert.  Psychiatric: Her behavior is normal. Mood, judgment and thought content normal.    MAU Course  Procedures  --Cervix closed --Reactive tracing: baseline 140, mod var, + accels, no decels --Toco: Irregular recurrent contractions q 2-8 min, unable to palpate, not felt by patient --Advised IV fluid bolus, possibly Procardia for toco. Patient agreeable to IV fluids, declines all other interventions --Abnormal UA. Presence of squam suggests contaminated catch. No urinary symptoms, advised recollection at next appointment  Orders Placed This Encounter  Procedures  . Urinalysis, Routine w reflex microscopic  . CBC  . Comprehensive metabolic panel  . Insert peripheral IV   Meds ordered this encounter  Medications  . lactated ringers bolus 1,000 mL  . acetaminophen (TYLENOL) tablet 1,000 mg   Results  for orders placed or performed during the hospital encounter of 11/10/19 (from the past 24 hour(s))  Urinalysis, Routine w reflex microscopic     Status: Abnormal   Collection Time: 11/10/19  2:15 PM  Result Value Ref Range   Color, Urine YELLOW YELLOW   APPearance HAZY (A) CLEAR   Specific Gravity, Urine 1.017 1.005 - 1.030   pH 6.0 5.0 - 8.0   Glucose, UA NEGATIVE NEGATIVE mg/dL   Hgb urine dipstick NEGATIVE NEGATIVE   Bilirubin Urine NEGATIVE NEGATIVE   Ketones, ur NEGATIVE NEGATIVE mg/dL   Protein, ur NEGATIVE NEGATIVE mg/dL   Nitrite NEGATIVE  NEGATIVE   Leukocytes,Ua SMALL (A) NEGATIVE   RBC / HPF 0-5 0 - 5 RBC/hpf   WBC, UA 21-50 0 - 5 WBC/hpf   Bacteria, UA RARE (A) NONE SEEN   Squamous Epithelial / LPF 6-10 0 - 5   Mucus PRESENT   CBC     Status: Abnormal   Collection Time: 11/10/19  2:45 PM  Result Value Ref Range   WBC 8.4 4.0 - 10.5 K/uL   RBC 3.27 (L) 3.87 - 5.11 MIL/uL   Hemoglobin 9.7 (L) 12.0 - 15.0 g/dL   HCT 16.0 (L) 36 - 46 %   MCV 92.0 80.0 - 100.0 fL   MCH 29.7 26.0 - 34.0 pg   MCHC 32.2 30.0 - 36.0 g/dL   RDW 10.9 32.3 - 55.7 %   Platelets 142 (L) 150 - 400 K/uL   nRBC 0.0 0.0 - 0.2 %  Comprehensive metabolic panel     Status: Abnormal   Collection Time: 11/10/19  2:45 PM  Result Value Ref Range   Sodium 136 135 - 145 mmol/L   Potassium 4.1 3.5 - 5.1 mmol/L   Chloride 106 98 - 111 mmol/L   CO2 21 (L) 22 - 32 mmol/L   Glucose, Bld 88 70 - 99 mg/dL   BUN <5 (L) 6 - 20 mg/dL   Creatinine, Ser 3.22 (L) 0.44 - 1.00 mg/dL   Calcium 9.1 8.9 - 02.5 mg/dL   Total Protein 5.8 (L) 6.5 - 8.1 g/dL   Albumin 2.9 (L) 3.5 - 5.0 g/dL   AST 32 15 - 41 U/L   ALT 16 0 - 44 U/L   Alkaline Phosphatase 62 38 - 126 U/L   Total Bilirubin 0.4 0.3 - 1.2 mg/dL   GFR calc non Af Amer >60 >60 mL/min   GFR calc Af Amer >60 >60 mL/min   Anion gap 9 5 - 15   Assessment and Plan  --19 y.o. G2P0010 at [redacted]w[redacted]d  --Continue PNV + PO iron daily as previously advised --Reviewed expectations for caloric intake, hydration in third trimester --Discharge home in stable condition  F/U: --Patient has appointment with [facility] next week  Calvert Cantor, CNM 11/10/2019, 6:15 PM

## 2019-11-10 NOTE — MAU Note (Signed)
Presents with c/o nausea and dizziness.  Reports hasn't vomited, feels light headed and dizzy.  Hasn't passed out, but felt like it.  Denies VB or LOF.  Unsure if FM today.

## 2019-11-10 NOTE — Discharge Instructions (Signed)
How a Baby Grows During Pregnancy  Pregnancy begins when a female's sperm enters a female's egg (fertilization). Fertilization usually happens in one of the tubes (fallopian tubes) that connect the ovaries to the womb (uterus). The fertilized egg moves down the fallopian tube to the uterus. Once it reaches the uterus, it implants into the lining of the uterus and begins to grow. For the first 10 weeks, the fertilized egg is called an embryo. After 10 weeks, it is called a fetus. As the fetus continues to grow, it receives oxygen and nutrients through tissue (placenta) that grows to support the developing baby. The placenta is the life support system for the baby. It provides oxygen and nutrition and removes waste. Learning as much as you can about your pregnancy and how your baby is developing can help you enjoy the experience. It can also make you aware of when there might be a problem and when to ask questions. How long does a typical pregnancy last? A pregnancy usually lasts 280 days, or about 40 weeks. Pregnancy is divided into three periods of growth, also called trimesters:  First trimester: 0-12 weeks.  Second trimester: 13-27 weeks.  Third trimester: 28-40 weeks. The day when your baby is ready to be born (full term) is your estimated date of delivery. How does my baby develop month by month? First month  The fertilized egg attaches to the inside of the uterus.  Some cells will form the placenta. Others will form the fetus.  The arms, legs, brain, spinal cord, lungs, and heart begin to develop.  At the end of the first month, the heart begins to beat. Second month  The bones, inner ear, eyelids, hands, and feet form.  The genitals develop.  By the end of 8 weeks, all major organs are developing. Third month  All of the internal organs are forming.  Teeth develop below the gums.  Bones and muscles begin to grow. The spine can flex.  The skin is transparent.  Fingernails  and toenails begin to form.  Arms and legs continue to grow longer, and hands and feet develop.  The fetus is about 3 inches (7.6 cm) long. Fourth month  The placenta is completely formed.  The external sex organs, neck, outer ear, eyebrows, eyelids, and fingernails are formed.  The fetus can hear, swallow, and move its arms and legs.  The kidneys begin to produce urine.  The skin is covered with a white, waxy coating (vernix) and very fine hair (lanugo). Fifth month  The fetus moves around more and can be felt for the first time (quickening).  The fetus starts to sleep and wake up and may begin to suck its finger.  The nails grow to the end of the fingers.  The organ in the digestive system that makes bile (gallbladder) functions and helps to digest nutrients.  If your baby is a girl, eggs are present in her ovaries. If your baby is a boy, testicles start to move down into his scrotum. Sixth month  The lungs are formed.  The eyes open. The brain continues to develop.  Your baby has fingerprints and toe prints. Your baby's hair grows thicker.  At the end of the second trimester, the fetus is about 9 inches (22.9 cm) long. Seventh month  The fetus kicks and stretches.  The eyes are developed enough to sense changes in light.  The hands can make a grasping motion.  The fetus responds to sound. Eighth month  All   organs and body systems are fully developed and functioning.  Bones harden, and taste buds develop. The fetus may hiccup.  Certain areas of the brain are still developing. The skull remains soft. Ninth month  The fetus gains about  lb (0.23 kg) each week.  The lungs are fully developed.  Patterns of sleep develop.  The fetus's head typically moves into a head-down position (vertex) in the uterus to prepare for birth.  The fetus weighs 6-9 lb (2.72-4.08 kg) and is 19-20 inches (48.26-50.8 cm) long. What can I do to have a healthy pregnancy and help  my baby develop? General instructions  Take prenatal vitamins as directed by your health care provider. These include vitamins such as folic acid, iron, calcium, and vitamin D. They are important for healthy development.  Take medicines only as directed by your health care provider. Read labels and ask a pharmacist or your health care provider whether over-the-counter medicines, supplements, and prescription drugs are safe to take during pregnancy.  Keep all follow-up visits as directed by your health care provider. This is important. Follow-up visits include prenatal care and screening tests. How do I know if my baby is developing well? At each prenatal visit, your health care provider will do several different tests to check on your health and keep track of your baby's development. These include:  Fundal height and position. ? Your health care provider will measure your growing belly from your pubic bone to the top of the uterus using a tape measure. ? Your health care provider will also feel your belly to determine your baby's position.  Heartbeat. ? An ultrasound in the first trimester can confirm pregnancy and show a heartbeat, depending on how far along you are. ? Your health care provider will check your baby's heart rate at every prenatal visit.  Second trimester ultrasound. ? This ultrasound checks your baby's development. It also may show your baby's gender. What should I do if I have concerns about my baby's development? Always talk with your health care provider about any concerns that you may have about your pregnancy and your baby. Summary  A pregnancy usually lasts 280 days, or about 40 weeks. Pregnancy is divided into three periods of growth, also called trimesters.  Your health care provider will monitor your baby's growth and development throughout your pregnancy.  Follow your health care provider's recommendations about taking prenatal vitamins and medicines during  your pregnancy.  Talk with your health care provider if you have any concerns about your pregnancy or your developing baby. This information is not intended to replace advice given to you by your health care provider. Make sure you discuss any questions you have with your health care provider. Document Revised: 08/29/2018 Document Reviewed: 03/21/2017 [employer] Patient Education  2020 [employer]  

## 2019-11-20 DIAGNOSIS — Z419 Encounter for procedure for purposes other than remedying health state, unspecified: Secondary | ICD-10-CM | POA: Diagnosis not present

## 2019-12-01 ENCOUNTER — Inpatient Hospital Stay (HOSPITAL_COMMUNITY)
Admission: AD | Admit: 2019-12-01 | Discharge: 2019-12-01 | Disposition: A | Discharge status: Home / Self Care | Payer: Medicaid Other | Attending: Obstetrics | Admitting: Obstetrics

## 2019-12-01 ENCOUNTER — Encounter (HOSPITAL_COMMUNITY): Payer: Self-pay | Admitting: Obstetrics

## 2019-12-01 ENCOUNTER — Inpatient Hospital Stay (HOSPITAL_BASED_OUTPATIENT_CLINIC_OR_DEPARTMENT_OTHER): Payer: Medicaid Other

## 2019-12-01 ENCOUNTER — Other Ambulatory Visit: Payer: Self-pay

## 2019-12-01 DIAGNOSIS — Z3689 Encounter for other specified antenatal screening: Secondary | ICD-10-CM | POA: Diagnosis not present

## 2019-12-01 DIAGNOSIS — D649 Anemia, unspecified: Secondary | ICD-10-CM | POA: Diagnosis not present

## 2019-12-01 DIAGNOSIS — O99013 Anemia complicating pregnancy, third trimester: Secondary | ICD-10-CM | POA: Diagnosis not present

## 2019-12-01 DIAGNOSIS — O368130 Decreased fetal movements, third trimester, not applicable or unspecified: Secondary | ICD-10-CM | POA: Diagnosis not present

## 2019-12-01 DIAGNOSIS — O368190 Decreased fetal movements, unspecified trimester, not applicable or unspecified: Secondary | ICD-10-CM

## 2019-12-01 DIAGNOSIS — Z3A33 33 weeks gestation of pregnancy: Secondary | ICD-10-CM

## 2019-12-01 LAB — CBC
HCT: 28.9 % — ABNORMAL LOW (ref 36.0–46.0)
Hemoglobin: 9.3 g/dL — ABNORMAL LOW (ref 12.0–15.0)
MCH: 29.4 pg (ref 26.0–34.0)
MCHC: 32.2 g/dL (ref 30.0–36.0)
MCV: 91.5 fL (ref 80.0–100.0)
Platelets: 139 10*3/uL — ABNORMAL LOW (ref 150–400)
RBC: 3.16 MIL/uL — ABNORMAL LOW (ref 3.87–5.11)
RDW: 13.2 % (ref 11.5–15.5)
WBC: 7.9 10*3/uL (ref 4.0–10.5)
nRBC: 0 % (ref 0.0–0.2)

## 2019-12-01 NOTE — Discharge Instructions (Signed)
Fetal Movement Counts Patient Name: ________________________________________________ Patient Due Date: ____________________ What is a fetal movement count?  A fetal movement count is the number of times that you feel your baby move during a certain amount of time. This may also be called a fetal kick count. A fetal movement count is recommended for every pregnant woman. You may be asked to start counting fetal movements as early as week 28 of your pregnancy. Pay attention to when your baby is most active. You may notice your baby's sleep and wake cycles. You may also notice things that make your baby move more. You should do a fetal movement count:  When your baby is normally most active.  At the same time each day. A good time to count movements is while you are resting, after having something to eat and drink. How do I count fetal movements? 1. Find a quiet, comfortable area. Sit, or lie down on your side. 2. Write down the date, the start time and stop time, and the number of movements that you felt between those two times. Take this information with you to your health care visits. 3. Write down your start time when you feel the first movement. 4. Count kicks, flutters, swishes, rolls, and jabs. You should feel at least 10 movements. 5. You may stop counting after you have felt 10 movements, or if you have been counting for 2 hours. Write down the stop time. 6. If you do not feel 10 movements in 2 hours, contact your health care provider for further instructions. Your health care provider may want to do additional tests to assess your baby's well-being. Contact a health care provider if:  You feel fewer than 10 movements in 2 hours.  Your baby is not moving like he or she usually does. Date: ____________ Start time: ____________ Stop time: ____________ Movements: ____________ Date: ____________ Start time: ____________ Stop time: ____________ Movements: ____________ Date: ____________  Start time: ____________ Stop time: ____________ Movements: ____________ Date: ____________ Start time: ____________ Stop time: ____________ Movements: ____________ Date: ____________ Start time: ____________ Stop time: ____________ Movements: ____________ Date: ____________ Start time: ____________ Stop time: ____________ Movements: ____________ Date: ____________ Start time: ____________ Stop time: ____________ Movements: ____________ Date: ____________ Start time: ____________ Stop time: ____________ Movements: ____________ Date: ____________ Start time: ____________ Stop time: ____________ Movements: ____________ This information is not intended to replace advice given to you by your health care provider. Make sure you discuss any questions you have with your health care provider. Document Revised: 12/26/2018 Document Reviewed: 12/26/2018 Elsevier Patient Education  2020 [employer].         Third Trimester of Pregnancy The third trimester is from week 28 through week 40 (months 7 through 9). The third trimester is a time when the unborn baby (fetus) is growing rapidly. At the end of the ninth month, the fetus is about 20 inches in length and weighs 6-10 pounds. Body changes during your third trimester Your body will continue to go through many changes during pregnancy. The changes vary from woman to woman. During the third trimester:  Your weight will continue to increase. You can expect to gain 25-35 pounds (11-16 kg) by the end of the pregnancy.  You may begin to get stretch marks on your hips, abdomen, and breasts.  You may urinate more often because the fetus is moving lower into your pelvis and pressing on your bladder.  You may develop or continue to have heartburn. This is caused by increased hormones  that slow down muscles in the digestive tract.  You may develop or continue to have constipation because increased hormones slow digestion and cause the muscles that push  waste through your intestines to relax.  You may develop hemorrhoids. These are swollen veins (varicose veins) in the rectum that can itch or be painful.  You may develop swollen, bulging veins (varicose veins) in your legs.  You may have increased body aches in the pelvis, back, or thighs. This is due to weight gain and increased hormones that are relaxing your joints.  You may have changes in your hair. These can include thickening of your hair, rapid growth, and changes in texture. Some women also have hair loss during or after pregnancy, or hair that feels dry or thin. Your hair will most likely return to normal after your baby is born.  Your breasts will continue to grow and they will continue to become tender. A yellow fluid (colostrum) may leak from your breasts. This is the first milk you are producing for your baby.  Your belly button may stick out.  You may notice more swelling in your hands, face, or ankles.  You may have increased tingling or numbness in your hands, arms, and legs. The skin on your belly may also feel numb.  You may feel short of breath because of your expanding uterus.  You may have more problems sleeping. This can be caused by the size of your belly, increased need to urinate, and an increase in your body's metabolism.  You may notice the fetus "dropping," or moving lower in your abdomen (lightening).  You may have increased vaginal discharge.  You may notice your joints feel loose and you may have pain around your pelvic bone. What to expect at prenatal visits You will have prenatal exams every 2 weeks until week 36. Then you will have weekly prenatal exams. During a routine prenatal visit:  You will be weighed to make sure you and the baby are growing normally.  Your blood pressure will be taken.  Your abdomen will be measured to track your baby's growth.  The fetal heartbeat will be listened to.  Any test results from the previous visit will be  discussed.  You may have a cervical check near your due date to see if your cervix has softened or thinned (effaced).  You will be tested for Group B streptococcus. This happens between 35 and 37 weeks. Your health care provider may ask you:  What your birth plan is.  How you are feeling.  If you are feeling the baby move.  If you have had any abnormal symptoms, such as leaking fluid, bleeding, severe headaches, or abdominal cramping.  If you are using any tobacco products, including cigarettes, chewing tobacco, and electronic cigarettes.  If you have any questions. Other tests or screenings that may be performed during your third trimester include:  Blood tests that check for low iron levels (anemia).  Fetal testing to check the health, activity level, and growth of the fetus. Testing is done if you have certain medical conditions or if there are problems during the pregnancy.  Nonstress test (NST). This test checks the health of your baby to make sure there are no signs of problems, such as the baby not getting enough oxygen. During this test, a belt is placed around your belly. The baby is made to move, and its heart rate is monitored during movement. What is false labor? False labor is a condition in  which you feel small, irregular tightenings of the muscles in the womb (contractions) that usually go away with rest, changing position, or drinking water. These are called Braxton Hicks contractions. Contractions may last for hours, days, or even weeks before true labor sets in. If contractions come at regular intervals, become more frequent, increase in intensity, or become painful, you should see your health care provider. What are the signs of labor?  Abdominal cramps.  Regular contractions that start at 10 minutes apart and become stronger and more frequent with time.  Contractions that start on the top of the uterus and spread down to the lower abdomen and back.  Increased  pelvic pressure and dull back pain.  A watery or bloody mucus discharge that comes from the vagina.  Leaking of amniotic fluid. This is also known as your "water breaking." It could be a slow trickle or a gush. Let your health care provider know if it has a color or strange odor. If you have any of these signs, call your health care provider right away, even if it is before your due date. Follow these instructions at home: Medicines  Follow your health care provider's instructions regarding medicine use. Specific medicines may be either safe or unsafe to take during pregnancy.  Take a prenatal vitamin that contains at least 600 micrograms (mcg) of folic acid.  If you develop constipation, try taking a stool softener if your health care provider approves. Eating and drinking   Eat a balanced diet that includes fresh fruits and vegetables, whole grains, good sources of protein such as meat, eggs, or tofu, and low-fat dairy. Your health care provider will help you determine the amount of weight gain that is right for you.  Avoid raw meat and uncooked cheese. These carry germs that can cause birth defects in the baby.  If you have low calcium intake from food, talk to your health care provider about whether you should take a daily calcium supplement.  Eat four or five small meals rather than three large meals a day.  Limit foods that are high in fat and processed sugars, such as fried and sweet foods.  To prevent constipation: ? Drink enough fluid to keep your urine clear or pale yellow. ? Eat foods that are high in fiber, such as fresh fruits and vegetables, whole grains, and beans. Activity  Exercise only as directed by your health care provider. Most women can continue their usual exercise routine during pregnancy. Try to exercise for 30 minutes at least 5 days a week. Stop exercising if you experience uterine contractions.  Avoid heavy lifting.  Do not exercise in extreme heat or  humidity, or at high altitudes.  Wear low-heel, comfortable shoes.  Practice good posture.  You may continue to have sex unless your health care provider tells you otherwise. Relieving pain and discomfort  Take frequent breaks and rest with your legs elevated if you have leg cramps or low back pain.  Take warm sitz baths to soothe any pain or discomfort caused by hemorrhoids. Use hemorrhoid cream if your health care provider approves.  Wear a good support bra to prevent discomfort from breast tenderness.  If you develop varicose veins: ? Wear support pantyhose or compression stockings as told by your healthcare provider. ? Elevate your feet for 15 minutes, 3-4 times a day. Prenatal care  Write down your questions. Take them to your prenatal visits.  Keep all your prenatal visits as told by your health care provider.  This is important. Safety  Wear your seat belt at all times when driving.  Make a list of emergency phone numbers, including numbers for family, friends, the hospital, and police and fire departments. General instructions  Avoid cat litter boxes and soil used by cats. These carry germs that can cause birth defects in the baby. If you have a cat, ask someone to clean the litter box for you.  Do not travel far distances unless it is absolutely necessary and only with the approval of your health care provider.  Do not use hot tubs, steam rooms, or saunas.  Do not drink alcohol.  Do not use any products that contain nicotine or tobacco, such as cigarettes and e-cigarettes. If you need help quitting, ask your health care provider.  Do not use any medicinal herbs or unprescribed drugs. These chemicals affect the formation and growth of the baby.  Do not douche or use tampons or scented sanitary pads.  Do not cross your legs for long periods of time.  To prepare for the arrival of your baby: ? Take prenatal classes to understand, practice, and ask questions about  labor and delivery. ? Make a trial run to the hospital. ? Visit the hospital and tour the maternity area. ? Arrange for maternity or paternity leave through employers. ? Arrange for family and friends to take care of pets while you are in the hospital. ? Purchase a rear-facing car seat and make sure you know how to install it in your car. ? Pack your hospital bag. ? Prepare the baby's nursery. Make sure to remove all pillows and stuffed animals from the baby's crib to prevent suffocation.  Visit your dentist if you have not gone during your pregnancy. Use a soft toothbrush to brush your teeth and be gentle when you floss. Contact a health care provider if:  You are unsure if you are in labor or if your water has broken.  You become dizzy.  You have mild pelvic cramps, pelvic pressure, or nagging pain in your abdominal area.  You have lower back pain.  You have persistent nausea, vomiting, or diarrhea.  You have an unusual or bad smelling vaginal discharge.  You have pain when you urinate. Get help right away if:  Your water breaks before 37 weeks.  You have regular contractions less than 5 minutes apart before 37 weeks.  You have a fever.  You are leaking fluid from your vagina.  You have spotting or bleeding from your vagina.  You have severe abdominal pain or cramping.  You have rapid weight loss or weight gain.  You have shortness of breath with chest pain.  You notice sudden or extreme swelling of your face, hands, ankles, feet, or legs.  Your baby makes fewer than 10 movements in 2 hours.  You have severe headaches that do not go away when you take medicine.  You have vision changes. Summary  The third trimester is from week 28 through week 40, months 7 through 9. The third trimester is a time when the unborn baby (fetus) is growing rapidly.  During the third trimester, your discomfort may increase as you and your baby continue to gain weight. You may have  abdominal, leg, and back pain, sleeping problems, and an increased need to urinate.  During the third trimester your breasts will keep growing and they will continue to become tender. A yellow fluid (colostrum) may leak from your breasts. This is the first milk you are producing  for your baby.  False labor is a condition in which you feel small, irregular tightenings of the muscles in the womb (contractions) that eventually go away. These are called Braxton Hicks contractions. Contractions may last for hours, days, or even weeks before true labor sets in.  Signs of labor can include: abdominal cramps; regular contractions that start at 10 minutes apart and become stronger and more frequent with time; watery or bloody mucus discharge that comes from the vagina; increased pelvic pressure and dull back pain; and leaking of amniotic fluid. This information is not intended to replace advice given to you by your health care provider. Make sure you discuss any questions you have with your health care provider. Document Revised: 08/29/2018 Document Reviewed: 06/13/2016 Elsevier Patient Education  2020 [employer].        Preterm Labor and Birth Information  The normal length of a pregnancy is 39-41 weeks. Preterm labor is when labor starts before 37 completed weeks of pregnancy. What are the risk factors for preterm labor? Preterm labor is more likely to occur in women who:  Have certain infections during pregnancy such as a bladder infection, sexually transmitted infection, or infection inside the uterus (chorioamnionitis).  Have a shorter-than-normal cervix.  Have gone into preterm labor before.  Have had surgery on their cervix.  Are younger than age 58 or older than age 85.  Are African American.  Are pregnant with twins or multiple babies (multiple gestation).  Take street drugs or smoke while pregnant.  Do not gain enough weight while pregnant.  Became pregnant shortly after  having been pregnant. What are the symptoms of preterm labor? Symptoms of preterm labor include:  Cramps similar to those that can happen during a menstrual period. The cramps may happen with diarrhea.  Pain in the abdomen or lower back.  Regular uterine contractions that may feel like tightening of the abdomen.  A feeling of increased pressure in the pelvis.  Increased watery or bloody mucus discharge from the vagina.  Water breaking (ruptured amniotic sac). Why is it important to recognize signs of preterm labor? It is important to recognize signs of preterm labor because babies who are born prematurely may not be fully developed. This can put them at an increased risk for:  Long-term (chronic) heart and lung problems.  Difficulty immediately after birth with regulating body systems, including blood sugar, body temperature, heart rate, and breathing rate.  Bleeding in the brain.  Cerebral palsy.  Learning difficulties.  Death. These risks are highest for babies who are born before 34 weeks of pregnancy. How is preterm labor treated? Treatment depends on the length of your pregnancy, your condition, and the health of your baby. It may involve:  Having a stitch (suture) placed in your cervix to prevent your cervix from opening too early (cerclage).  Taking or being given medicines, such as: ? Hormone medicines. These may be given early in pregnancy to help support the pregnancy. ? Medicine to stop contractions. ? Medicines to help mature the baby's lungs. These may be prescribed if the risk of delivery is high. ? Medicines to prevent your baby from developing cerebral palsy. If the labor happens before 34 weeks of pregnancy, you may need to stay in the hospital. What should I do if I think I am in preterm labor? If you think that you are going into preterm labor, call your health care provider right away. How can I prevent preterm labor in future pregnancies? To increase  your chance of having a full-term pregnancy:  Do not use any tobacco products, such as cigarettes, chewing tobacco, and e-cigarettes. If you need help quitting, ask your health care provider.  Do not use street drugs or medicines that have not been prescribed to you during your pregnancy.  Talk with your health care provider before taking any herbal supplements, even if you have been taking them regularly.  Make sure you gain a healthy amount of weight during your pregnancy.  Watch for infection. If you think that you might have an infection, get it checked right away.  Make sure to tell your health care provider if you have gone into preterm labor before. This information is not intended to replace advice given to you by your health care provider. Make sure you discuss any questions you have with your health care provider. Document Revised: 08/30/2018 Document Reviewed: 09/29/2015 Elsevier Patient Education  2020 [employer].

## 2019-12-01 NOTE — [facility] Note (Signed)
Pt reports she has only felt fetal movement x 2 today. Denies pain or bleeding

## 2019-12-01 NOTE — [facility] Note (Signed)
Pt reports feeling some movement since being here in [facility].

## 2019-12-01 NOTE — MAU Provider Note (Signed)
History     CSN: [id number]  Arrival date and time: 12/01/19 1730   First Provider Initiated Contact with Patient 12/01/19 1806      Chief Complaint  Patient presents with  . Decreased Fetal Movement   Ms. LATOSHA GAYLORD is a 19 y.o. G2P0010 at [redacted]w[redacted]d who presents to MAU for feeling decreased fetal movement beginning a week ago. Patient states "it might be because I haven't been paying as much attention."  Last food/drink: 1 hour ago Smoker? no Current medications/supplements: PNVs, iron Recent AFI: none on file Anterior placenta? unknown Doing FKCs? no Problems this pregnancy include: anemia Pt denies prior instances of DFM. Pt denies all risk factors for stillbirth, including, but not limited to: IUGR, placental abruption, infection, genetic/congenital anomalies, fetomaternal hemorrhage, DM, HTN, smoking/drug use, umbilical cord/placental abnormalities, uterine abnormalities, fetal hydrops, arrythmia, platelet dysfunction, IHCP.  Pt denies VB, LOF, ctx, vaginal discharge/odor/itching.  Allergies? NKDA Prenatal care provider/next appt? Nestor Ramp, 12/09/2019   OB History    Gravida  2   Para      Term      Preterm      AB  1   Living  0     SAB      TAB      Ectopic      Multiple      Live Births              History reviewed. No pertinent past medical history.  Past Surgical History:  Procedure Laterality Date  . DILATION AND CURETTAGE OF UTERUS      History reviewed. No pertinent family history.  Social History   Tobacco Use  . Smoking status: Never Smoker  . Smokeless tobacco: Never Used  Vaping Use  . Vaping Use: Never used  Substance Use Topics  . Alcohol use: Never  . Drug use: Never    Allergies: No Known Allergies  Medications Prior to Admission  Medication Sig Dispense Refill Last Dose  . Prenat-FeAsp-Meth-FA-DHA w/o A (PRENATE PIXIE) 10-0.6-0.4-200 MG CAPS SMARTSIG:1 Capsule(s) By Mouth Daily   12/01/2019 at Unknown  time    Review of Systems  Constitutional: Negative for chills, diaphoresis, fatigue and fever.  Eyes: Negative for visual disturbance.  Respiratory: Negative for shortness of breath.   Cardiovascular: Negative for chest pain.  Gastrointestinal: Negative for abdominal pain, constipation, diarrhea, nausea and vomiting.  Genitourinary: Negative for dysuria, flank pain, frequency, pelvic pain, urgency, vaginal bleeding and vaginal discharge.  Neurological: Negative for dizziness, weakness, light-headedness and headaches.   Physical Exam   Blood pressure 121/71, pulse 84, temperature 99.1 F (37.3 C), temperature source Oral, resp. rate 17, height 5' (1.524 m), weight 60.8 kg, last menstrual period 04/09/2019, SpO2 99 %.  Patient Vitals for the past 24 hrs:  BP Temp Temp src Pulse Resp SpO2 Height Weight  12/01/19 1756 121/71 99.1 F (37.3 C) Oral 84 17 99 % 5' (1.524 m) 60.8 kg   Physical Exam Constitutional:      General: She is not in acute distress.    Appearance: Normal appearance. She is normal weight. She is not ill-appearing, toxic-appearing or diaphoretic.  HENT:     Head: Normocephalic and atraumatic.  Pulmonary:     Effort: Pulmonary effort is normal.  Neurological:     Mental Status: She is alert and oriented to person, place, and time.  Psychiatric:        Mood and Affect: Mood normal.  Behavior: Behavior normal.        Thought Content: Thought content normal.        Judgment: Judgment normal.    Results for orders placed or performed during the hospital encounter of 12/01/19 (from the past 24 hour(s))  CBC     Status: Abnormal   Collection Time: 12/01/19  6:21 PM  Result Value Ref Range   WBC 7.9 4.0 - 10.5 K/uL   RBC 3.16 (L) 3.87 - 5.11 MIL/uL   Hemoglobin 9.3 (L) 12.0 - 15.0 g/dL   HCT 86.7 (L) 36 - 46 %   MCV 91.5 80.0 - 100.0 fL   MCH 29.4 26.0 - 34.0 pg   MCHC 32.2 30.0 - 36.0 g/dL   RDW 61.9 50.9 - 32.6 %   Platelets 139 (L) 150 - 400 K/uL    nRBC 0.0 0.0 - 0.2 %    No results found.  MAU Course  Procedures  MDM -DFM since one week -iced juice and snacks given -clicker given to patient to record fetal movement, patient pressed button 34 times in 2 hours; patient pushed clicker 11 times in first 7 minutes of tracing -CBC: H/H 9.3/28.9, pt is already on oral iron and reports last hgb was 9.5; platelets 139, [facility] sent to [facility] review -EFM: reactive       -baseline: 140       -variability: moderate       -accels: present       -decels: no decels per Dr. Macon Large       -TOCO: few, irregular Dilation: 1 Effacement (%): Thick Cervical Position: Posterior Station: Ballotable Presentation: Undeterminable Exam by:: NN -after drink/snacks, pt reports she is unsure if movement has returned to normal as she has been working a lot this past week and not paying attention to the fetal movement -BPP ordered, 8/8 -pt discharged to home in stable condition  Orders Placed This Encounter  Procedures  . [place] MFM FETAL BPP WO NON STRESS    Standing Status:   Standing    Number of Occurrences:   1    Order Specific Question:   Symptom/Reason for Exam    Answer:   Decreased fetal movement [[id number]]  . CBC    Standing Status:   Standing    Number of Occurrences:   1  . Discharge patient    Order Specific Question:   Discharge disposition    Answer:   01-Home or Self Care [1]    Order Specific Question:   Discharge patient date    Answer:   12/01/2019   No orders of the defined types were placed in this encounter.   Assessment and Plan   1. [redacted] weeks gestation of pregnancy   2. Decreased fetal movement   3. NST (non-stress test) reactive     Allergies as of 12/01/2019   No Known Allergies     Medication List    TAKE these medications   Prenate Pixie 10-0.6-0.4-200 MG Caps SMARTSIG:1 Capsule(s) By Mouth Daily       -discussed that mothers do not perceive 100% of fetal movement, and there is wide variation of  mother perception of fetal movement -discussed that fetal movement varies somewhat depending on the time of day and gestational age and there is a wide variety of normal movement among healthy babies -discussed that frequency of movement typically increases from morning to night, with peak activity late at night -discussed that fetal sleep cycles become longer with  advancing gestation and can last about 20-56minutes -pt advised to contact HCP immediately if noticing a decrease in fetal movement compared to what is normal -discussed FKCs vs. monitoring movements; can either do fetal kick counting, or just be aware of what is normal for baby in terms of movement; no one method is better than the other -FKC: at least 10 fetal movements (FMs) over up to two hours when at rest and focused on counting -return MAU precautions given -pt discharged to home in stable condition  Joni Reining E Iman Orourke 12/01/2019, 8:28 PM

## 2019-12-10 ENCOUNTER — Other Ambulatory Visit (HOSPITAL_COMMUNITY): Payer: Self-pay | Admitting: *Deleted

## 2019-12-11 ENCOUNTER — Other Ambulatory Visit: Payer: Self-pay

## 2019-12-11 ENCOUNTER — Encounter (HOSPITAL_COMMUNITY)
Admission: RE | Admit: 2019-12-11 | Discharge: 2019-12-11 | Disposition: A | Discharge status: Home / Self Care | Payer: Medicaid Other | Source: Ambulatory Visit | Attending: Obstetrics and Gynecology | Admitting: Obstetrics and Gynecology

## 2019-12-11 DIAGNOSIS — D649 Anemia, unspecified: Secondary | ICD-10-CM | POA: Insufficient documentation

## 2019-12-11 DIAGNOSIS — O99019 Anemia complicating pregnancy, unspecified trimester: Secondary | ICD-10-CM | POA: Insufficient documentation

## 2019-12-11 DIAGNOSIS — Z3A00 Weeks of gestation of pregnancy not specified: Secondary | ICD-10-CM | POA: Insufficient documentation

## 2019-12-11 MED ORDER — SODIUM CHLORIDE 0.9 % IV SOLN
510.0000 mg | INTRAVENOUS | Status: DC
  Administered 2019-12-11: 510 mg via INTRAVENOUS
  Filled 2019-12-11: qty 17

## 2019-12-11 NOTE — Discharge Instructions (Signed)

## 2019-12-18 ENCOUNTER — Other Ambulatory Visit: Payer: Self-pay

## 2019-12-18 ENCOUNTER — Encounter (HOSPITAL_COMMUNITY)
Admission: RE | Admit: 2019-12-18 | Discharge: 2019-12-18 | Disposition: A | Discharge status: Home / Self Care | Payer: Medicaid Other | Source: Ambulatory Visit | Attending: Obstetrics and Gynecology | Admitting: Obstetrics and Gynecology

## 2019-12-18 DIAGNOSIS — Z3480 Encounter for supervision of other normal pregnancy, unspecified trimester: Secondary | ICD-10-CM | POA: Diagnosis not present

## 2019-12-18 DIAGNOSIS — D649 Anemia, unspecified: Secondary | ICD-10-CM | POA: Diagnosis not present

## 2019-12-18 DIAGNOSIS — O99019 Anemia complicating pregnancy, unspecified trimester: Secondary | ICD-10-CM | POA: Diagnosis not present

## 2019-12-18 DIAGNOSIS — Z3A00 Weeks of gestation of pregnancy not specified: Secondary | ICD-10-CM | POA: Diagnosis not present

## 2019-12-18 MED ORDER — SODIUM CHLORIDE 0.9 % IV SOLN
510.0000 mg | INTRAVENOUS | Status: DC
  Administered 2019-12-18: 510 mg via INTRAVENOUS
  Filled 2019-12-18: qty 17

## 2019-12-21 DIAGNOSIS — Z419 Encounter for procedure for purposes other than remedying health state, unspecified: Secondary | ICD-10-CM | POA: Diagnosis not present

## 2019-12-22 ENCOUNTER — Inpatient Hospital Stay (HOSPITAL_COMMUNITY)
Admission: AD | Admit: 2019-12-22 | Discharge: 2019-12-24 | DRG: 806 | Disposition: A | Discharge status: Home / Self Care | Payer: Medicaid Other | Attending: Obstetrics | Admitting: Obstetrics

## 2019-12-22 ENCOUNTER — Other Ambulatory Visit: Payer: Self-pay

## 2019-12-22 ENCOUNTER — Encounter (HOSPITAL_COMMUNITY): Payer: Self-pay | Admitting: Obstetrics and Gynecology

## 2019-12-22 ENCOUNTER — Inpatient Hospital Stay (HOSPITAL_COMMUNITY): Payer: Medicaid Other | Admitting: Anesthesiology

## 2019-12-22 DIAGNOSIS — O99824 Streptococcus B carrier state complicating childbirth: Secondary | ICD-10-CM | POA: Diagnosis present

## 2019-12-22 DIAGNOSIS — Z20822 Contact with and (suspected) exposure to covid-19: Secondary | ICD-10-CM | POA: Diagnosis present

## 2019-12-22 DIAGNOSIS — O9902 Anemia complicating childbirth: Secondary | ICD-10-CM | POA: Diagnosis present

## 2019-12-22 DIAGNOSIS — O42913 Preterm premature rupture of membranes, unspecified as to length of time between rupture and onset of labor, third trimester: Secondary | ICD-10-CM | POA: Diagnosis not present

## 2019-12-22 DIAGNOSIS — Z3A36 36 weeks gestation of pregnancy: Secondary | ICD-10-CM

## 2019-12-22 DIAGNOSIS — D509 Iron deficiency anemia, unspecified: Secondary | ICD-10-CM | POA: Diagnosis not present

## 2019-12-22 DIAGNOSIS — D6959 Other secondary thrombocytopenia: Secondary | ICD-10-CM | POA: Diagnosis present

## 2019-12-22 DIAGNOSIS — O9912 Other diseases of the blood and blood-forming organs and certain disorders involving the immune mechanism complicating childbirth: Secondary | ICD-10-CM | POA: Diagnosis present

## 2019-12-22 LAB — CBC
HCT: 32.2 % — ABNORMAL LOW (ref 36.0–46.0)
HCT: 34.4 % — ABNORMAL LOW (ref 36.0–46.0)
Hemoglobin: 10.3 g/dL — ABNORMAL LOW (ref 12.0–15.0)
Hemoglobin: 11.1 g/dL — ABNORMAL LOW (ref 12.0–15.0)
MCH: 30.5 pg (ref 26.0–34.0)
MCH: 29.9 pg (ref 26.0–34.0)
MCHC: 32 g/dL (ref 30.0–36.0)
MCHC: 32.3 g/dL (ref 30.0–36.0)
MCV: 95.3 fL (ref 80.0–100.0)
MCV: 92.7 fL (ref 80.0–100.0)
Platelets: 131 10*3/uL — ABNORMAL LOW (ref 150–400)
Platelets: 139 10*3/uL — ABNORMAL LOW (ref 150–400)
RBC: 3.38 MIL/uL — ABNORMAL LOW (ref 3.87–5.11)
RBC: 3.71 MIL/uL — ABNORMAL LOW (ref 3.87–5.11)
RDW: 14.3 % (ref 11.5–15.5)
RDW: 14.2 % (ref 11.5–15.5)
WBC: 7.4 10*3/uL (ref 4.0–10.5)
WBC: 14.9 10*3/uL — ABNORMAL HIGH (ref 4.0–10.5)
nRBC: 0 % (ref 0.0–0.2)
nRBC: 0 % (ref 0.0–0.2)

## 2019-12-22 LAB — TYPE AND SCREEN
ABO/RH(D): A POS
Antibody Screen: NEGATIVE

## 2019-12-22 LAB — RPR: RPR Ser Ql: NONREACTIVE

## 2019-12-22 LAB — POCT FERN TEST: POCT Fern Test: POSITIVE

## 2019-12-22 LAB — SARS CORONAVIRUS 2 BY RT PCR (HOSPITAL ORDER, PERFORMED IN ~~LOC~~ HOSPITAL LAB): SARS Coronavirus 2: NEGATIVE

## 2019-12-22 MED ORDER — SIMETHICONE 80 MG PO CHEW: 80.0000 mg | CHEWABLE_TABLET | ORAL | Status: DC | PRN

## 2019-12-22 MED ORDER — IBUPROFEN 600 MG PO TABS
600.0000 mg | ORAL_TABLET | Freq: Four times a day (QID) | ORAL | Status: DC
  Administered 2019-12-23 (×5): 600 mg via ORAL
  Filled 2019-12-22 (×7): qty 1

## 2019-12-22 MED ORDER — ACETAMINOPHEN 325 MG PO TABS: 650.0000 mg | ORAL_TABLET | ORAL | Status: DC | PRN

## 2019-12-22 MED ORDER — TETANUS-DIPHTH-ACELL PERTUSSIS 5-2.5-18.5 LF-MCG/0.5 IM SUSP: 0.5000 mL | Freq: Once | INTRAMUSCULAR | Status: DC

## 2019-12-22 MED ORDER — EPHEDRINE 5 MG/ML INJ: 10.0000 mg | INTRAVENOUS | Status: DC | PRN

## 2019-12-22 MED ORDER — LACTATED RINGERS IV SOLN: 500.0000 mL | INTRAVENOUS | Status: DC | PRN

## 2019-12-22 MED ORDER — LIDOCAINE HCL (PF) 1 % IJ SOLN: 30.0000 mL | INTRAMUSCULAR | Status: DC | PRN

## 2019-12-22 MED ORDER — SODIUM CHLORIDE (PF) 0.9 % IJ SOLN
INTRAMUSCULAR | Status: DC | PRN
  Administered 2019-12-22: 12 mL/h via EPIDURAL

## 2019-12-22 MED ORDER — BENZOCAINE-MENTHOL 20-0.5 % EX AERO
1.0000 "application " | INHALATION_SPRAY | CUTANEOUS | Status: DC | PRN
  Administered 2019-12-23: 1 via TOPICAL
  Filled 2019-12-22: qty 56

## 2019-12-22 MED ORDER — PENICILLIN G POT IN DEXTROSE 60000 UNIT/ML IV SOLN
3.0000 10*6.[IU] | INTRAVENOUS | Status: DC
  Administered 2019-12-22: 3 10*6.[IU] via INTRAVENOUS
  Filled 2019-12-22: qty 50

## 2019-12-22 MED ORDER — OXYCODONE-ACETAMINOPHEN 5-325 MG PO TABS: 2.0000 | ORAL_TABLET | ORAL | Status: DC | PRN

## 2019-12-22 MED ORDER — FENTANYL CITRATE (PF) 100 MCG/2ML IJ SOLN
50.0000 ug | INTRAMUSCULAR | Status: DC | PRN
  Administered 2019-12-22: 100 ug via INTRAVENOUS
  Filled 2019-12-22: qty 2

## 2019-12-22 MED ORDER — ONDANSETRON HCL 4 MG/2ML IJ SOLN
4.0000 mg | Freq: Four times a day (QID) | INTRAMUSCULAR | Status: DC | PRN
  Administered 2019-12-22: 4 mg via INTRAVENOUS
  Filled 2019-12-22: qty 2

## 2019-12-22 MED ORDER — SENNOSIDES-DOCUSATE SODIUM 8.6-50 MG PO TABS
2.0000 | ORAL_TABLET | ORAL | Status: DC
  Administered 2019-12-23 (×2): 2 via ORAL
  Filled 2019-12-22 (×2): qty 2

## 2019-12-22 MED ORDER — COCONUT OIL OIL: 1.0000 "application " | TOPICAL_OIL | Status: DC | PRN

## 2019-12-22 MED ORDER — PHENYLEPHRINE 40 MCG/ML (10ML) SYRINGE FOR IV PUSH (FOR BLOOD PRESSURE SUPPORT): 80.0000 ug | PREFILLED_SYRINGE | INTRAVENOUS | Status: DC | PRN

## 2019-12-22 MED ORDER — OXYCODONE HCL 5 MG PO TABS: 5.0000 mg | ORAL_TABLET | ORAL | Status: DC | PRN

## 2019-12-22 MED ORDER — OXYTOCIN-SODIUM CHLORIDE 30-0.9 UT/500ML-% IV SOLN
1.0000 m[IU]/min | INTRAVENOUS | Status: DC
  Administered 2019-12-22: 2 m[IU]/min via INTRAVENOUS
  Filled 2019-12-22: qty 500

## 2019-12-22 MED ORDER — ONDANSETRON HCL 4 MG PO TABS: 4.0000 mg | ORAL_TABLET | ORAL | Status: DC | PRN

## 2019-12-22 MED ORDER — ONDANSETRON HCL 4 MG/2ML IJ SOLN: 4.0000 mg | INTRAMUSCULAR | Status: DC | PRN

## 2019-12-22 MED ORDER — SODIUM CHLORIDE 0.9 % IV SOLN
5.0000 10*6.[IU] | Freq: Once | INTRAVENOUS | Status: AC
  Administered 2019-12-22: 5 10*6.[IU] via INTRAVENOUS
  Filled 2019-12-22: qty 5

## 2019-12-22 MED ORDER — LACTATED RINGERS IV SOLN: 500.0000 mL | Freq: Once | INTRAVENOUS | Status: DC

## 2019-12-22 MED ORDER — OXYTOCIN BOLUS FROM INFUSION
333.0000 mL | Freq: Once | INTRAVENOUS | Status: AC
  Administered 2019-12-22: 333 mL via INTRAVENOUS

## 2019-12-22 MED ORDER — DIPHENHYDRAMINE HCL 50 MG/ML IJ SOLN: 12.5000 mg | INTRAMUSCULAR | Status: DC | PRN

## 2019-12-22 MED ORDER — PHENYLEPHRINE 40 MCG/ML (10ML) SYRINGE FOR IV PUSH (FOR BLOOD PRESSURE SUPPORT)
80.0000 ug | PREFILLED_SYRINGE | INTRAVENOUS | Status: DC | PRN
  Filled 2019-12-22: qty 10

## 2019-12-22 MED ORDER — FENTANYL-BUPIVACAINE-NACL 0.5-0.125-0.9 MG/250ML-% EP SOLN
12.0000 mL/h | EPIDURAL | Status: DC | PRN
  Filled 2019-12-22: qty 250

## 2019-12-22 MED ORDER — WITCH HAZEL-GLYCERIN EX PADS: 1.0000 "application " | MEDICATED_PAD | CUTANEOUS | Status: DC | PRN

## 2019-12-22 MED ORDER — OXYTOCIN-SODIUM CHLORIDE 30-0.9 UT/500ML-% IV SOLN: 2.5000 [IU]/h | INTRAVENOUS | Status: DC

## 2019-12-22 MED ORDER — OXYCODONE-ACETAMINOPHEN 5-325 MG PO TABS: 1.0000 | ORAL_TABLET | ORAL | Status: DC | PRN

## 2019-12-22 MED ORDER — DIBUCAINE (PERIANAL) 1 % EX OINT: 1.0000 "application " | TOPICAL_OINTMENT | CUTANEOUS | Status: DC | PRN

## 2019-12-22 MED ORDER — LACTATED RINGERS IV SOLN: INTRAVENOUS | Status: DC

## 2019-12-22 MED ORDER — SOD CITRATE-CITRIC ACID 500-334 MG/5ML PO SOLN: 30.0000 mL | ORAL | Status: DC | PRN

## 2019-12-22 MED ORDER — TERBUTALINE SULFATE 1 MG/ML IJ SOLN: 0.2500 mg | Freq: Once | INTRAMUSCULAR | Status: DC | PRN

## 2019-12-22 MED ORDER — OXYCODONE HCL 5 MG PO TABS: 10.0000 mg | ORAL_TABLET | ORAL | Status: DC | PRN

## 2019-12-22 MED ORDER — DIPHENHYDRAMINE HCL 25 MG PO CAPS: 25.0000 mg | ORAL_CAPSULE | Freq: Four times a day (QID) | ORAL | Status: DC | PRN

## 2019-12-22 MED ORDER — LIDOCAINE HCL (PF) 1 % IJ SOLN
INTRAMUSCULAR | Status: DC | PRN
  Administered 2019-12-22: 11 mL via EPIDURAL

## 2019-12-22 MED ORDER — FLEET ENEMA 7-19 GM/118ML RE ENEM: 1.0000 | ENEMA | RECTAL | Status: DC | PRN

## 2019-12-22 MED ORDER — PRENATAL MULTIVITAMIN CH
1.0000 | ORAL_TABLET | Freq: Every day | ORAL | Status: DC
  Administered 2019-12-23: 1 via ORAL
  Filled 2019-12-22 (×2): qty 1

## 2019-12-22 NOTE — H&P (Signed)
19 y.o. G2P0010 @ [redacted]w[redacted]d presents with rupture of membranes at 0530.  She was ruled in with positive fern in MAU.  She reports painful contractions at this time.  Otherwise has good fetal movement and no bleeding.  Pregnancy c/b: 1. History of anencephaly with G1.  Had normal AFP and detailed anatomy scan with MFM this pregnancy 2. Iron deficiency anemia:  Received IV iron infusion (second infusion 7/29) 3. Benign gestational thrombocytopenia: stable 130-140s over last month  History reviewed. No pertinent past medical history.   Past Surgical History:  Procedure Laterality Date  . DILATION AND CURETTAGE OF UTERUS      OB History  Gravida Para Term Preterm AB Living  2       1 0  SAB TAB Ectopic Multiple Live Births               # Outcome Date GA Lbr Len/2nd Weight Sex Delivery Anes PTL Lv  2 Current           1 AB 01/21/19 [redacted]w[redacted]d    TAB       Social History   Socioeconomic History  . Marital status: Single    Spouse name: Not on file  . Number of children: Not on file  . Years of education: Not on file  . Highest education level: Not on file  Occupational History  . Not on file  Tobacco Use  . Smoking status: Never Smoker  . Smokeless tobacco: Never Used  Vaping Use  . Vaping Use: Never used  Substance and Sexual Activity  . Alcohol use: Never  . Drug use: Never  . Sexual activity: Yes  Other Topics Concern  . Not on file  Social History Narrative  . Not on file     Patient has no known allergies.    Prenatal Transfer Tool  Maternal Diabetes: No Genetic Screening: Normal Maternal Ultrasounds/Referrals: Normal Fetal Ultrasounds or other Referrals:  Referred to Materal Fetal Medicine --h/o anencephaly as above Maternal Substance Abuse:  No Significant Maternal Medications:  None Significant Maternal Lab Results: Group B Strep positive  ABO, Rh: --/--/A POS (08/02 0720) Antibody: NEG (08/02 0720) Rubella:  Immune RPR:   NR HBsAg:   Neg HIV:   Neg GBS:    positive    Vitals:   12/22/19 0705 12/22/19 0808  BP: 117/80 125/76  Pulse: 89 92  Resp:  18  Temp:  (!) 97.5 F (36.4 C)  SpO2: 100%      General:  NAD Abdomen:  soft, gravid, EFW 5.5-6# Ex:  no edema SVE:  3/90/-1 FHTs:  120s, moderate variability, + accels, no decels Toco:  Irregular q2-8 minutes   A/P   19 y.o. G2P0010 [redacted]w[redacted]d presents with PPROM Admit to L&D Contracting irregularly--Will start pitocin for labor augmentation Epidural upon request FSR/ vtx/ GBS positive--PCN  [facility] GEFFEL Francelia Mclaren

## 2019-12-22 NOTE — Anesthesia Procedure Notes (Signed)
Epidural Patient location during procedure: OB Start time: 12/22/2019 11:37 AM End time: 12/22/2019 11:50 AM  Staffing Anesthesiologist: Lowella Curb, MD Performed: anesthesiologist   Preanesthetic Checklist Completed: patient identified, IV checked, site marked, risks and benefits discussed, surgical consent, monitors and equipment checked, pre-op evaluation and timeout performed  Epidural Patient position: sitting Prep: ChloraPrep Patient monitoring: heart rate, cardiac monitor, continuous pulse ox and blood pressure Approach: midline Location: L2-L3 Injection technique: LOR saline  Needle:  Needle type: Tuohy  Needle gauge: 17 G Needle length: 9 cm Needle insertion depth: 4 cm Catheter type: closed end flexible Catheter size: 20 Guage Catheter at skin depth: 8 cm Test dose: negative  Assessment Events: blood not aspirated, injection not painful, no injection resistance, no paresthesia and negative IV test  Additional Notes Epidural placed by SRNA under direct supervisionReason for block:procedure for pain

## 2019-12-22 NOTE — Anesthesia Preprocedure Evaluation (Signed)

## 2019-12-22 NOTE — MAU Note (Signed)
.   Cheyenne Medina is a 19 y.o. at [redacted]w[redacted]d here in MAU reporting: SROM at 0540 this morning. Clear fluid. Fern +. No VB. Endorses fetal movement.   Pain score: 2 Vitals:   12/22/19 0654  BP: 127/80  Pulse: 86  Resp: 16  Temp: 98.6 F (37 C)  SpO2: 100%     FHT:145

## 2019-12-23 LAB — CBC
HCT: 30 % — ABNORMAL LOW (ref 36.0–46.0)
Hemoglobin: 9.7 g/dL — ABNORMAL LOW (ref 12.0–15.0)
MCH: 29.9 pg (ref 26.0–34.0)
MCHC: 32.3 g/dL (ref 30.0–36.0)
MCV: 92.6 fL (ref 80.0–100.0)
Platelets: 123 10*3/uL — ABNORMAL LOW (ref 150–400)
RBC: 3.24 MIL/uL — ABNORMAL LOW (ref 3.87–5.11)
RDW: 14.3 % (ref 11.5–15.5)
WBC: 11.6 10*3/uL — ABNORMAL HIGH (ref 4.0–10.5)
nRBC: 0 % (ref 0.0–0.2)

## 2019-12-23 NOTE — Progress Notes (Signed)
Patient is eating, ambulating, voiding.  Pain control is good. Appropriate lochia, no complaints.  Vitals:   12/22/19 2012 12/23/19 0022 12/23/19 0512 12/23/19 0925  BP: 121/68 129/79 113/70 115/68  Pulse: 82 76 80 79  Resp: 13 15 16 16   Temp:  98.3 F (36.8 C)  97.9 F (36.6 C)  TempSrc:  Oral  Oral  SpO2: 100% 100% 99%   Weight:      Height:        Fundus firm Ext: no calf tenderness Lab Results  Component Value Date   WBC 11.6 (H) 12/23/2019   HGB 9.7 (L) 12/23/2019   HCT 30.0 (L) 12/23/2019   MCV 92.6 12/23/2019   PLT 123 (L) 12/23/2019    --/--/A POS (08/02 0720)  A/P Post partum day 1. Gestational thrombocytopenia- Plt stable Iron deficiency anemia- cont. PNV with iron daily  Routine care.  Expect d/c 8/4.    10/4

## 2019-12-23 NOTE — Anesthesia Postprocedure Evaluation (Signed)
Anesthesia Post Note  Patient: Cheyenne Medina  Procedure(s) Performed: AN AD HOC LABOR EPIDURAL     Patient location during evaluation: Mother Baby Anesthesia Type: Epidural Level of consciousness: awake, awake and alert and oriented Pain management: pain level controlled Vital Signs Assessment: post-procedure vital signs reviewed and stable Respiratory status: spontaneous breathing and respiratory function stable Cardiovascular status: blood pressure returned to baseline Postop Assessment: no headache, epidural receding, patient able to bend at knees, adequate PO intake, no backache, no apparent nausea or vomiting and able to ambulate Anesthetic complications: no   No complications documented.  Last Vitals:  Vitals:   12/23/19 0022 12/23/19 0512  BP: 129/79 113/70  Pulse: 76 80  Resp: 15 16  Temp: 36.8 C   SpO2: 100% 99%    Last Pain:  Vitals:   12/23/19 0715  TempSrc:   PainSc: 0-No pain   Pain Goal:                   Cleda Clarks

## 2019-12-24 NOTE — Progress Notes (Signed)
Patient is eating, ambulating, voiding.  Pain control is good.  Vitals:   12/23/19 0925 12/23/19 1406 12/23/19 2148 12/24/19 0550  BP: 115/68 116/66 118/72 112/83  Pulse: 79 75 67 62  Resp: 16 16 16 18   Temp: 97.9 F (36.6 C) 98.7 F (37.1 C) 98.2 F (36.8 C) 98 F (36.7 C)  TempSrc: Oral Axillary Oral Oral  SpO2:   99%   Weight:      Height:        Fundus firm Perineum without swelling.  Lab Results  Component Value Date   WBC 11.6 (H) 12/23/2019   HGB 9.7 (L) 12/23/2019   HCT 30.0 (L) 12/23/2019   MCV 92.6 12/23/2019   PLT 123 (L) 12/23/2019    --/--/A POS (08/02 0720)/RI  A/P Post partum day 2.  Routine care.  Expect d/c today.    02-22-1979

## 2019-12-24 NOTE — Discharge Summary (Signed)
Postpartum Discharge Summary  Date of Service updated     Patient Name: Cheyenne Medina DOB: 12-15-2000 MRN: [id number]  Date of admission: 12/22/2019 Delivery date:12/22/2019  Delivering provider: Jerelyn Charles  Date of discharge: 12/24/2019  Admitting diagnosis: Normal labor and delivery [O80] Intrauterine pregnancy: [redacted]w[redacted]d    Secondary diagnosis:  Active Problems:   Normal labor and delivery  Additional problems: iron deficiency, benign thrombocytopenia    Discharge diagnosis: Term Pregnancy Delivered                                              Post partum procedures:none Augmentation: Pitocin Complications: None  Hospital course: Onset of Labor With Vaginal Delivery      19y.o. yo G2P0111 at [id number] admitted in Latent Labor on 12/22/2019. Patient had an uncomplicated labor course as follows:  Membrane Rupture Time/Date: 5:40 AM ,12/22/2019   Delivery Method:Vaginal, Spontaneous  Episiotomy: None  Lacerations:  None  Patient had an uncomplicated postpartum course.  She is ambulating, tolerating a regular diet, passing flatus, and urinating well. Patient is discharged home in stable condition on 12/24/19.  Newborn Data: Birth date:12/22/2019  Birth time:3:48 PM  Gender:Female  Living status:Living  Apgars:5 ,9  Weight:2610 g   Magnesium Sulfate received: No BMZ received: No Rhophylac:No MMR:No T-DaP:Given prenatally Flu: No Transfusion:No  Physical exam  Vitals:   12/23/19 0925 12/23/19 1406 12/23/19 2148 12/24/19 0550  BP: 115/68 116/66 118/72 112/83  Pulse: 79 75 67 62  Resp: _0 Temp: 97.9 F (36.6 C) 98.7 F (37.1 C) 98.2 F (36.8 C) 98 F (36.7 C)  TempSrc: Oral Axillary Oral Oral  SpO2:   99%   Weight:      Height:        Labs: Lab Results  Component Value Date   WBC 11.6 (H) 12/23/2019   HGB 9.7 (L) 12/23/2019   HCT 30.0 (L) 12/23/2019   MCV 92.6 12/23/2019   PLT 123 (L) 12/23/2019   CMP Latest Ref Rng & Units 11/10/2019   Glucose 70 - 99 mg/dL 88  BUN 6 - 20 mg/dL <5(L)  Creatinine 0.44 - 1.00 mg/dL 0.43(L)  Sodium 135 - 145 mmol/L 136  Potassium 3.5 - 5.1 mmol/L 4.1  Chloride 98 - 111 mmol/L 106  CO2 22 - 32 mmol/L 21(L)  Calcium 8.9 - 10.3 mg/dL 9.1  Total Protein 6.5 - 8.1 g/dL 5.8(L)  Total Bilirubin 0.3 - 1.2 mg/dL 0.4  Alkaline Phos 38 - 126 U/L 62  AST 15 - 41 U/L 32  ALT 0 - 44 U/L 16   Edinburgh Score: Edinburgh Postnatal Depression Scale Screening Tool 12/23/2019  I have been able to laugh and see the funny side of things. 0  I have looked forward with enjoyment to things. 0  I have blamed myself unnecessarily when things went wrong. 1  I have been anxious or worried for no good reason. 1  I have felt scared or panicky for no good reason. 0  Things have been getting on top of me. 0  I have been so unhappy that I have had difficulty sleeping. 0  I have felt sad or miserable. 0  I have been so unhappy that I have been crying. 0  The thought of harming myself has occurred to me. 0  Edinburgh Postnatal Depression  Scale Total 2      After visit meds:  Allergies as of 12/24/2019   No Known Allergies     Medication List    TAKE these medications   ferrous sulfate 325 (65 FE) MG EC tablet Take 1 tablet by mouth daily.   M-Natal Plus 27-1 MG Tabs Take 1 tablet by mouth at bedtime.            Discharge Care Instructions  (From admission, onward)         Start     Ordered   12/24/19 0000  Discharge wound care:       Comments: Sitz baths and icepacks to perineum.  If stitches, they will dissolve.   12/24/19 0807           Discharge home in stable condition Infant Feeding: ? Infant Disposition:home with mother Discharge instruction: per After Visit Summary and Postpartum booklet. Activity: Advance as tolerated. Pelvic rest for 6 weeks.  Diet: routine diet Anticipated Birth Control: Unsure Postpartum Appointment:4 weeks Additional Postpartum F/U: none Future  Appointments:No future appointments. Follow up Visit:  Follow-up Information    Jerelyn Charles, MD Follow up in 4 week(s).   Specialty: Obstetrics Contact information: [address] [place] [address] [phone]                   12/24/2019 Daria Pastures, MD

## 2020-01-21 DIAGNOSIS — Z124 Encounter for screening for malignant neoplasm of cervix: Secondary | ICD-10-CM | POA: Diagnosis not present

## 2020-01-21 DIAGNOSIS — Z1331 Encounter for screening for depression: Secondary | ICD-10-CM | POA: Diagnosis not present

## 2020-01-21 DIAGNOSIS — Z419 Encounter for procedure for purposes other than remedying health state, unspecified: Secondary | ICD-10-CM | POA: Diagnosis not present

## 2020-02-20 DIAGNOSIS — Z419 Encounter for procedure for purposes other than remedying health state, unspecified: Secondary | ICD-10-CM | POA: Diagnosis not present

## 2020-03-22 DIAGNOSIS — Z419 Encounter for procedure for purposes other than remedying health state, unspecified: Secondary | ICD-10-CM | POA: Diagnosis not present

## 2020-04-21 DIAGNOSIS — Z419 Encounter for procedure for purposes other than remedying health state, unspecified: Secondary | ICD-10-CM | POA: Diagnosis not present

## 2020-05-22 DIAGNOSIS — Z419 Encounter for procedure for purposes other than remedying health state, unspecified: Secondary | ICD-10-CM | POA: Diagnosis not present

## 2020-06-22 DIAGNOSIS — Z419 Encounter for procedure for purposes other than remedying health state, unspecified: Secondary | ICD-10-CM | POA: Diagnosis not present

## 2020-07-20 DIAGNOSIS — Z419 Encounter for procedure for purposes other than remedying health state, unspecified: Secondary | ICD-10-CM | POA: Diagnosis not present

## 2020-08-20 DIAGNOSIS — Z419 Encounter for procedure for purposes other than remedying health state, unspecified: Secondary | ICD-10-CM | POA: Diagnosis not present

## 2020-09-19 DIAGNOSIS — Z419 Encounter for procedure for purposes other than remedying health state, unspecified: Secondary | ICD-10-CM | POA: Diagnosis not present

## 2020-10-03 ENCOUNTER — Emergency Department (HOSPITAL_COMMUNITY)
Admission: EM | Admit: 2020-10-03 | Discharge: 2020-10-04 | Disposition: A | Discharge status: Home / Self Care | Payer: Medicaid Other | Attending: Emergency Medicine | Admitting: Emergency Medicine

## 2020-10-03 ENCOUNTER — Other Ambulatory Visit: Payer: Self-pay

## 2020-10-03 ENCOUNTER — Encounter (HOSPITAL_COMMUNITY): Payer: Self-pay | Admitting: Emergency Medicine

## 2020-10-03 ENCOUNTER — Emergency Department (HOSPITAL_COMMUNITY): Payer: Medicaid Other

## 2020-10-03 DIAGNOSIS — S61432A Puncture wound without foreign body of left hand, initial encounter: Secondary | ICD-10-CM | POA: Insufficient documentation

## 2020-10-03 DIAGNOSIS — Z23 Encounter for immunization: Secondary | ICD-10-CM | POA: Diagnosis not present

## 2020-10-03 DIAGNOSIS — W298XXA Contact with other powered powered hand tools and household machinery, initial encounter: Secondary | ICD-10-CM | POA: Diagnosis not present

## 2020-10-03 DIAGNOSIS — S6992XA Unspecified injury of left wrist, hand and finger(s), initial encounter: Secondary | ICD-10-CM

## 2020-10-03 DIAGNOSIS — M79642 Pain in left hand: Secondary | ICD-10-CM | POA: Diagnosis not present

## 2020-10-03 MED ORDER — TETANUS-DIPHTH-ACELL PERTUSSIS 5-2.5-18.5 LF-MCG/0.5 IM SUSY
0.5000 mL | PREFILLED_SYRINGE | Freq: Once | INTRAMUSCULAR | Status: AC
  Administered 2020-10-04: 0.5 mL via INTRAMUSCULAR
  Filled 2020-10-03: qty 0.5

## 2020-10-03 MED ORDER — IBUPROFEN 400 MG PO TABS: 600.0000 mg | ORAL_TABLET | Freq: Once | ORAL | Status: DC

## 2020-10-03 NOTE — ED Triage Notes (Signed)
Pt c/o left hand pain after accidentally poking the palm of her hand with the metal end of her comb. Unknown last tetanus.

## 2020-10-03 NOTE — ED Provider Notes (Signed)
Emergency Medicine Provider Triage Evaluation Note  JANDI SWIGER , a 20 y.o. female  was evaluated in triage.  Pt complains of an injury to her left palm.  States she accidentally impaled her hand with a thin metal spike from a comb.  Unknown last tetanus update.  Review of Systems  Positive: Hand wound Negative: Numbness, weakness  Physical Exam  BP 123/82   Pulse 66   Temp 99.1 F (37.3 C)   Resp 14   SpO2 100%  Gen:   Awake, no distress   Resp:  Normal effort  MSK:   Moves extremities without difficulty  Other:  Full range of motion in the fingers of the left hand.  Tiny puncture wound to the palm of the left hand.  Sensation intact in the fingers.  Medical Decision Making  Medically screening exam initiated at 6:42 PM.  Appropriate orders placed.  PHALLON HAYDU was informed that the remainder of the evaluation will be completed by another provider, this initial triage assessment does not replace that evaluation, and the importance of remaining in the ED until their evaluation is complete.     Anselm Pancoast, PA-C 10/03/20 1844    Tegeler, Canary Brim, MD 10/03/20 2030

## 2020-10-04 MED ORDER — AMOXICILLIN-POT CLAVULANATE 875-125 MG PO TABS
1.0000 | ORAL_TABLET | Freq: Once | ORAL | Status: AC
  Administered 2020-10-04: 1 via ORAL
  Filled 2020-10-04: qty 1

## 2020-10-04 MED ORDER — AMOXICILLIN-POT CLAVULANATE 875-125 MG PO TABS
1.0000 | ORAL_TABLET | Freq: Two times a day (BID) | ORAL | 0 refills | Status: DC
Start: 2020-10-04 — End: 2021-08-17

## 2020-10-04 NOTE — ED Notes (Signed)
Patient verbalizes understanding of discharge instructions. Prescriptions reviewed. Opportunity for questioning and answers were provided. Armband removed by staff, pt discharged from ED ambulatory. ° °

## 2020-10-04 NOTE — ED Provider Notes (Signed)
[facility] EMERGENCY DEPARTMENT Provider Note   CSN: [id number] Arrival date & time: 10/03/20  1833     History Chief Complaint  Patient presents with  . Hand Injury    Cheyenne Medina is a 20 y.o. female.  Patient presents to the emergency department with a chief complaint of left hand pain.  She states that she sustained a small puncture wound to her left palm.  This happened when she accidentally stabbed herself with a metal hairbrush.  She complains of mild pain.  She denies difficulty extending or flexing her fingers.  The history is provided by the patient. No language interpreter was used.       History reviewed. No pertinent past medical history.  Patient Active Problem List   Diagnosis Date Noted  . Normal labor and delivery 12/22/2019    Past Surgical History:  Procedure Laterality Date  . DILATION AND CURETTAGE OF UTERUS       OB History    Gravida  2   Para  1   Term      Preterm  1   AB  1   Living  1     SAB      IAB      Ectopic      Multiple  0   Live Births  1           No family history on file.  Social History   Tobacco Use  . Smoking status: Never Smoker  . Smokeless tobacco: Never Used  Vaping Use  . Vaping Use: Never used  Substance Use Topics  . Alcohol use: Never  . Drug use: Never    Home Medications Prior to Admission medications   Medication Sig Start Date End Date Taking? Authorizing Provider  amoxicillin-clavulanate (AUGMENTIN) 875-125 MG tablet Take 1 tablet by mouth every 12 (twelve) hours. 10/04/20  Yes Roxy Horseman, PA-C  ferrous sulfate 325 (65 FE) MG EC tablet Take 1 tablet by mouth daily. 11/26/19   [provider]  Prenatal Vit-Fe Fumarate-FA (M-NATAL PLUS) 27-1 MG TABS Take 1 tablet by mouth at bedtime. 12/21/19   [provider]    Allergies    Patient has no known allergies.  Review of Systems   Review of Systems  All other systems reviewed and are  negative.   Physical Exam Updated Vital Signs BP 108/70 (BP Location: Right Arm)   Pulse (!) 57   Temp 98.7 F (37.1 C) (Oral)   Resp 15   SpO2 100%   Physical Exam Vitals and nursing note reviewed.  Constitutional:      General: She is not in acute distress.    Appearance: She is well-developed.  HENT:     Head: Normocephalic and atraumatic.  Eyes:     Conjunctiva/sclera: Conjunctivae normal.  Cardiovascular:     Rate and Rhythm: Normal rate.     Heart sounds: No murmur heard.   Pulmonary:     Effort: Pulmonary effort is normal. No respiratory distress.  Abdominal:     General: There is no distension.  Musculoskeletal:     Cervical back: Neck supple.     Comments: Normal range of motion and strength of left fingers Small puncture wound to left palm near the base of the third finger  Skin:    General: Skin is warm and dry.  Neurological:     Mental Status: She is alert and oriented to person, place, and time.  Psychiatric:  Mood and Affect: Mood normal.        Behavior: Behavior normal.     ED Results / Procedures / Treatments   Labs (all labs ordered are listed, but only abnormal results are displayed) Labs Reviewed - No data to display  EKG None  Radiology DG Hand Complete Left  Result Date: 10/03/2020 CLINICAL DATA:  Pt c/o left hand pain after accidentally poking the palm of her hand with the metal end of her comb. EXAM: LEFT HAND - COMPLETE 3+ VIEW COMPARISON:  None. FINDINGS: There is no evidence of fracture or dislocation. There is no evidence of arthropathy or other focal bone abnormality. Soft tissues are unremarkable. No radiopaque foreign body identified. IMPRESSION: Negative left hand radiographs. Electronically Signed   By: Emmaline Kluver M.D.   On: 10/03/2020 20:42    Procedures Procedures   Medications Ordered in ED Medications  Tdap (BOOSTRIX) injection 0.5 mL (has no administration in time range)  ibuprofen (ADVIL) tablet 600  mg (has no administration in time range)  amoxicillin-clavulanate (AUGMENTIN) 875-125 MG per tablet 1 tablet (has no administration in time range)    ED Course  I have reviewed the triage vital signs and the nursing notes.  Pertinent labs & imaging results that were available during my care of the patient were reviewed by me and considered in my medical decision making (see chart for details).    MDM Rules/Calculators/A&P                          Patient here with small puncture wound to left palm from a metal hairbrush.  Tetanus shot updated.  She has no evidence of flexor tendon injury or flexor tenosynovitis.  I did discuss return precautions with the patient.  We will start on antibiotics due to the location.  Patient understands agrees with the plan. Final Clinical Impression(s) / ED Diagnoses Final diagnoses:  Injury of left hand, initial encounter    Rx / DC Orders ED Discharge Orders         Ordered    amoxicillin-clavulanate (AUGMENTIN) 875-125 MG tablet  Every 12 hours        10/04/20 0005           Roxy Horseman, PA-C 10/04/20 0009    Nira Conn, MD 10/04/20 2114

## 2020-10-04 NOTE — Discharge Instructions (Addendum)
If you notice redness, swelling, or worsening pain when you extend your finger, please return to the ER.

## 2020-10-05 ENCOUNTER — Telehealth: Payer: Self-pay

## 2020-10-05 NOTE — Telephone Encounter (Signed)
Transition Care Management Unsuccessful Follow-up Telephone Call  Date of discharge and from where:  10/04/2020 from [facility]  Attempts:  1st Attempt  Reason for unsuccessful TCM follow-up call:  Left voice message

## 2020-10-06 NOTE — Telephone Encounter (Signed)
Transition Care Management Follow-up Telephone Call  Date of discharge and from where: 10/04/2020 from [facility]  How have you been since you were released from the hospital? Pt stated that she is feeling well and has no questions or concerns.   Any questions or concerns? No  Items Reviewed:  Did the pt receive and understand the discharge instructions provided? Yes   Medications obtained and verified? Yes   Other? No   Any new allergies since your discharge? No   Dietary orders reviewed? n/a  Do you have support at home? Yes   Functional Questionnaire: (I = Independent and D = Dependent) ADLs: I  Bathing/Dressing- I  Meal Prep- I  Eating- I  Maintaining continence- I  Transferring/Ambulation- I  Managing Meds- I   Follow up appointments reviewed:   PCP Hospital f/u appt confirmed? No  Pt is going to call [facility] to establish care.   Specialist Hospital f/u appt confirmed? No    Are transportation arrangements needed? No   If their condition worsens, is the pt aware to call PCP or go to the Emergency Dept.? Yes  Was the patient provided with contact information for the PCP's office or ED? Yes  Was to pt encouraged to call back with questions or concerns? Yes

## 2020-10-16 ENCOUNTER — Emergency Department (HOSPITAL_BASED_OUTPATIENT_CLINIC_OR_DEPARTMENT_OTHER)
Admission: EM | Admit: 2020-10-16 | Discharge: 2020-10-16 | Disposition: A | Discharge status: Home / Self Care | Payer: Medicaid Other | Attending: Emergency Medicine | Admitting: Emergency Medicine

## 2020-10-16 ENCOUNTER — Other Ambulatory Visit: Payer: Self-pay

## 2020-10-16 DIAGNOSIS — S61216A Laceration without foreign body of right little finger without damage to nail, initial encounter: Secondary | ICD-10-CM | POA: Insufficient documentation

## 2020-10-16 DIAGNOSIS — Y92838 Other recreation area as the place of occurrence of the external cause: Secondary | ICD-10-CM | POA: Diagnosis not present

## 2020-10-16 DIAGNOSIS — S6991XA Unspecified injury of right wrist, hand and finger(s), initial encounter: Secondary | ICD-10-CM | POA: Diagnosis present

## 2020-10-16 DIAGNOSIS — S0083XA Contusion of other part of head, initial encounter: Secondary | ICD-10-CM | POA: Diagnosis not present

## 2020-10-16 DIAGNOSIS — T148XXA Other injury of unspecified body region, initial encounter: Secondary | ICD-10-CM

## 2020-10-16 NOTE — Discharge Instructions (Signed)
Return for confusion, vomiting.  Follow up with your family doctor.
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
## 2020-10-18 ENCOUNTER — Telehealth: Payer: Self-pay

## 2020-10-18 NOTE — Telephone Encounter (Signed)
Transition Care Management Follow-up Telephone Call  Date of discharge and from where: 10/16/20 from [facility]  How have you been since you were released from the hospital? Pt stated that she is feeling well and has no questions or concerns at this time.   Any questions or concerns? No  Items Reviewed:  Did the pt receive and understand the discharge instructions provided? Yes   Medications obtained and verified? Yes   Other? No   Any new allergies since your discharge? No   Dietary orders reviewed? n/a  Do you have support at home? Yes   Functional Questionnaire: (I = Independent and D = Dependent) ADLs: I  Bathing/Dressing- I  Meal Prep- I  Eating- I  Maintaining continence- I  Transferring/Ambulation- I  Managing Meds- I   Follow up appointments reviewed:   PCP Hospital f/u appt confirmed? No  [facility] f/u appt confirmed? No    Are transportation arrangements needed? No   If their condition worsens, is the pt aware to call PCP or go to the Emergency Dept.? Yes  Was the patient provided with contact information for the PCP's office or ED? Yes  Was to pt encouraged to call back with questions or concerns? Yes

## 2020-10-20 DIAGNOSIS — Z419 Encounter for procedure for purposes other than remedying health state, unspecified: Secondary | ICD-10-CM | POA: Diagnosis not present

## 2020-11-19 DIAGNOSIS — Z419 Encounter for procedure for purposes other than remedying health state, unspecified: Secondary | ICD-10-CM | POA: Diagnosis not present

## 2020-12-20 DIAGNOSIS — Z419 Encounter for procedure for purposes other than remedying health state, unspecified: Secondary | ICD-10-CM | POA: Diagnosis not present

## 2021-01-20 DIAGNOSIS — Z419 Encounter for procedure for purposes other than remedying health state, unspecified: Secondary | ICD-10-CM | POA: Diagnosis not present

## 2021-02-10 ENCOUNTER — Encounter (HOSPITAL_BASED_OUTPATIENT_CLINIC_OR_DEPARTMENT_OTHER): Payer: Self-pay | Admitting: Obstetrics and Gynecology

## 2021-02-10 ENCOUNTER — Emergency Department (HOSPITAL_BASED_OUTPATIENT_CLINIC_OR_DEPARTMENT_OTHER)
Admission: EM | Admit: 2021-02-10 | Discharge: 2021-02-10 | Disposition: A | Discharge status: Home / Self Care | Payer: Medicaid Other | Attending: Emergency Medicine | Admitting: Emergency Medicine

## 2021-02-10 ENCOUNTER — Other Ambulatory Visit: Payer: Self-pay

## 2021-02-10 DIAGNOSIS — Z202 Contact with and (suspected) exposure to infections with a predominantly sexual mode of transmission: Secondary | ICD-10-CM | POA: Diagnosis present

## 2021-02-10 DIAGNOSIS — B379 Candidiasis, unspecified: Secondary | ICD-10-CM | POA: Insufficient documentation

## 2021-02-10 LAB — URINALYSIS, ROUTINE W REFLEX MICROSCOPIC
Bilirubin Urine: NEGATIVE
Glucose, UA: NEGATIVE mg/dL
Hgb urine dipstick: NEGATIVE
Ketones, ur: NEGATIVE mg/dL
Leukocytes,Ua: NEGATIVE
Nitrite: NEGATIVE
Protein, ur: NEGATIVE mg/dL
Specific Gravity, Urine: 1.021 (ref 1.005–1.030)
pH: 6.5 (ref 5.0–8.0)

## 2021-02-10 LAB — HIV ANTIBODY (ROUTINE TESTING W REFLEX): HIV Screen 4th Generation wRfx: NONREACTIVE

## 2021-02-10 LAB — WET PREP, GENITAL
Clue Cells Wet Prep HPF POC: NONE SEEN
Sperm: NONE SEEN
Trich, Wet Prep: NONE SEEN

## 2021-02-10 MED ORDER — FLUCONAZOLE 150 MG PO TABS
150.0000 mg | ORAL_TABLET | Freq: Once | ORAL | Status: AC
  Administered 2021-02-10: 150 mg via ORAL
  Filled 2021-02-10: qty 1

## 2021-02-10 NOTE — Discharge Instructions (Signed)
Please follow up on your test results on your patient portal.  Please follow-up with the health department if anything comes back positive.

## 2021-02-10 NOTE — ED Triage Notes (Signed)
Patient reports to the ER for STD checkup. Patient denies d/c and reports she wants to get tested.
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
## 2021-02-11 ENCOUNTER — Telehealth: Payer: Self-pay

## 2021-02-11 LAB — GC/CHLAMYDIA PROBE AMP (~~LOC~~) NOT AT ARMC
Chlamydia: NEGATIVE
Comment: NEGATIVE
Comment: NORMAL
Neisseria Gonorrhea: NEGATIVE

## 2021-02-11 LAB — RPR: RPR Ser Ql: NONREACTIVE

## 2021-02-11 NOTE — Telephone Encounter (Signed)
Transition Care Management Follow-up Telephone Call Date of discharge and from where: 02/10/2021-[facility]  How have you been since you were released from the hospital? Patient stated she is doing ok.  Any questions or concerns? No  Items Reviewed: Did the pt receive and understand the discharge instructions provided? Yes  Medications obtained and verified?  No medications sent to pharmacy.  Other? No  Any new allergies since your discharge? No  Dietary orders reviewed? N/A Do you have support at home? Yes   Home Care and Equipment/Supplies: Were home health services ordered? not applicable If so, what is the name of the agency? N/A  Has the agency set up a time to come to the patient's home? not applicable Were any new equipment or medical supplies ordered?  No What is the name of the medical supply agency? N/A Were you able to get the supplies/equipment? not applicable Do you have any questions related to the use of the equipment or supplies? No  Functional Questionnaire: (I = Independent and D = Dependent) ADLs: I  Bathing/Dressing- I  Meal Prep- I  Eating- I  Maintaining continence- I  Transferring/Ambulation- I  Managing Meds- I  Follow up appointments reviewed:  PCP Hospital f/u appt confirmed? No   Specialist Hospital f/u appt confirmed? No   Are transportation arrangements needed? No  If their condition worsens, is the pt aware to call PCP or go to the Emergency Dept.? Yes Was the patient provided with contact information for the PCP's office or ED? Yes Was to pt encouraged to call back with questions or concerns? Yes

## 2021-02-19 DIAGNOSIS — Z419 Encounter for procedure for purposes other than remedying health state, unspecified: Secondary | ICD-10-CM | POA: Diagnosis not present

## 2021-03-09 DIAGNOSIS — Z0000 Encounter for general adult medical examination without abnormal findings: Secondary | ICD-10-CM | POA: Diagnosis not present

## 2021-03-09 DIAGNOSIS — Z13220 Encounter for screening for lipoid disorders: Secondary | ICD-10-CM | POA: Diagnosis not present

## 2021-03-09 DIAGNOSIS — Z130 Encounter for screening for diseases of the blood and blood-forming organs and certain disorders involving the immune mechanism: Secondary | ICD-10-CM | POA: Diagnosis not present

## 2021-03-09 DIAGNOSIS — Z1329 Encounter for screening for other suspected endocrine disorder: Secondary | ICD-10-CM | POA: Diagnosis not present

## 2021-03-09 DIAGNOSIS — N760 Acute vaginitis: Secondary | ICD-10-CM | POA: Diagnosis not present

## 2021-03-22 DIAGNOSIS — Z419 Encounter for procedure for purposes other than remedying health state, unspecified: Secondary | ICD-10-CM | POA: Diagnosis not present

## 2021-04-16 ENCOUNTER — Emergency Department (HOSPITAL_BASED_OUTPATIENT_CLINIC_OR_DEPARTMENT_OTHER): Payer: Medicaid Other

## 2021-04-16 ENCOUNTER — Emergency Department (HOSPITAL_BASED_OUTPATIENT_CLINIC_OR_DEPARTMENT_OTHER)
Admission: EM | Admit: 2021-04-16 | Discharge: 2021-04-16 | Disposition: A | Discharge status: Home / Self Care | Payer: Medicaid Other | Attending: Emergency Medicine | Admitting: Emergency Medicine

## 2021-04-16 ENCOUNTER — Other Ambulatory Visit: Payer: Self-pay

## 2021-04-16 ENCOUNTER — Encounter (HOSPITAL_BASED_OUTPATIENT_CLINIC_OR_DEPARTMENT_OTHER): Payer: Self-pay | Admitting: Emergency Medicine

## 2021-04-16 ENCOUNTER — Ambulatory Visit
Admission: EM | Admit: 2021-04-16 | Discharge: 2021-04-16 | Discharge status: Absent without leave | Payer: Medicaid Other

## 2021-04-16 DIAGNOSIS — Z113 Encounter for screening for infections with a predominantly sexual mode of transmission: Secondary | ICD-10-CM | POA: Diagnosis not present

## 2021-04-16 DIAGNOSIS — R6884 Jaw pain: Secondary | ICD-10-CM | POA: Insufficient documentation

## 2021-04-16 DIAGNOSIS — Z202 Contact with and (suspected) exposure to infections with a predominantly sexual mode of transmission: Secondary | ICD-10-CM | POA: Insufficient documentation

## 2021-04-16 LAB — PREGNANCY, URINE: Preg Test, Ur: NEGATIVE

## 2021-04-16 LAB — WET PREP, GENITAL
Clue Cells Wet Prep HPF POC: NONE SEEN
Sperm: NONE SEEN
Trich, Wet Prep: NONE SEEN
WBC, Wet Prep HPF POC: <10 (ref <10)
Yeast Wet Prep HPF POC: NONE SEEN

## 2021-04-16 MED ORDER — NAPROXEN 500 MG PO TABS: 500.0000 mg | ORAL_TABLET | Freq: Two times a day (BID) | ORAL | 0 refills | Status: AC
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
## 2021-04-16 NOTE — Discharge Instructions (Signed)
You have been tested for HIV, syphilis, chlamydia and gonorrhea.  These results will be available in approximately 3 days and you will be contacted by the hospital if the results are positive. Avoid sexual contact until you are aware of the results, and please inform all sexual partners if you test positive for any of these diseases.  You may alternate taking Tylenol and Naproxen as needed for pain control. You may take Naproxen twice daily as directed on your discharge paperwork and you may take  [phone] mg of Tylenol every 6 hours. Do not exceed 4000 mg of Tylenol daily as this can lead to liver damage. Also, make sure to take Naproxen with meals as it can cause an upset stomach. Do not take other NSAIDs while taking Naproxen such as (Aleve, Ibuprofen, Aspirin, Celebrex, etc) and do not take more than the prescribed dose as this can lead to ulcers and bleeding in your GI tract. You may use warm and cold compresses to help with your symptoms.   Please follow up with your primary doctor within the next 7-10 days for re-evaluation and further treatment of your symptoms.   Please return to the ER sooner if you have any new or worsening symptoms.

## 2021-04-18 LAB — GC/CHLAMYDIA PROBE AMP (~~LOC~~) NOT AT ARMC
Chlamydia: NEGATIVE
Comment: NEGATIVE
Comment: NORMAL
Neisseria Gonorrhea: NEGATIVE

## 2021-04-21 DIAGNOSIS — Z419 Encounter for procedure for purposes other than remedying health state, unspecified: Secondary | ICD-10-CM | POA: Diagnosis not present

## 2021-05-21 IMAGING — US US MFM FETAL BPP W/O NON-STRESS
1 series · 12 of 23 positions shown · non-contrast
Comparison: none

[Series 1: us mfm fetal bpp w/o non-stress · 23 acquisitions, 12 frames shown]
[im 1/23]
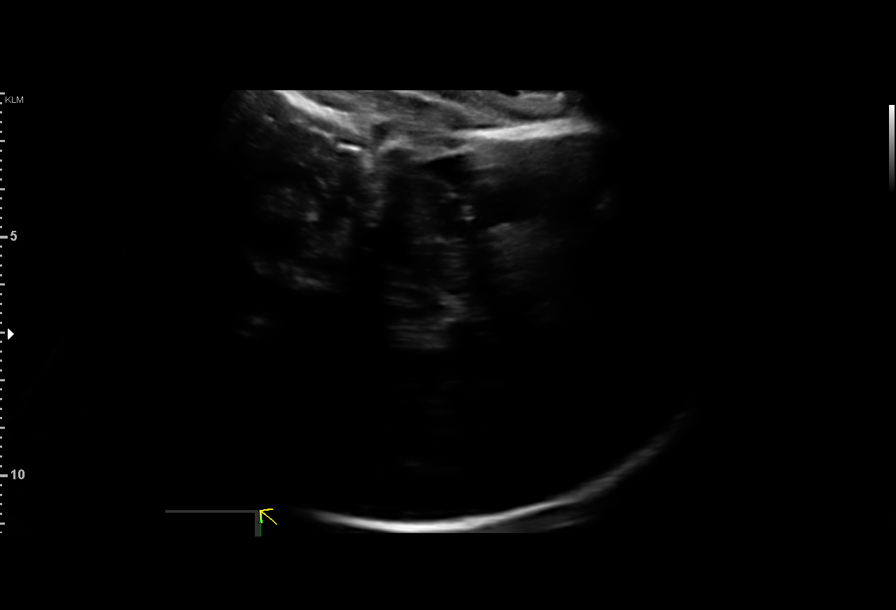
[im 3/23]
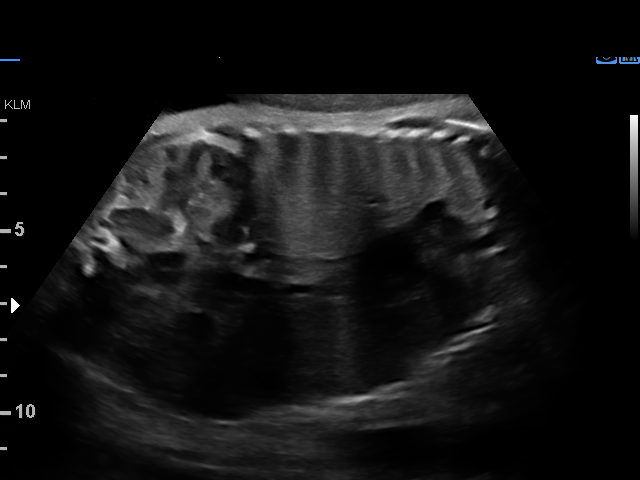
[im 5/23]
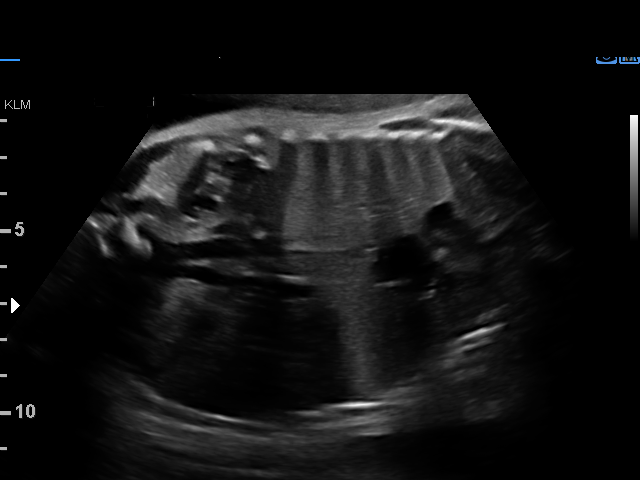
[im 7/23]
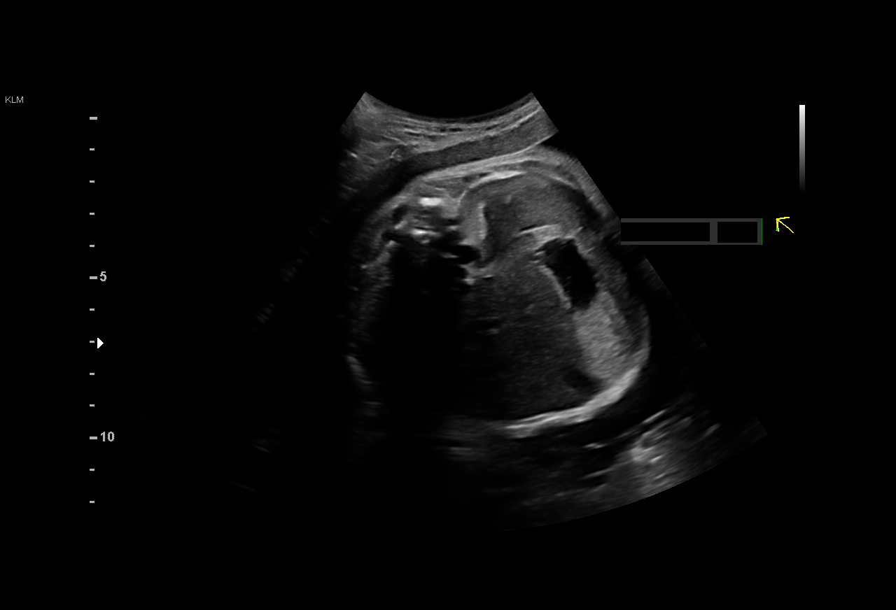
[im 9/23]
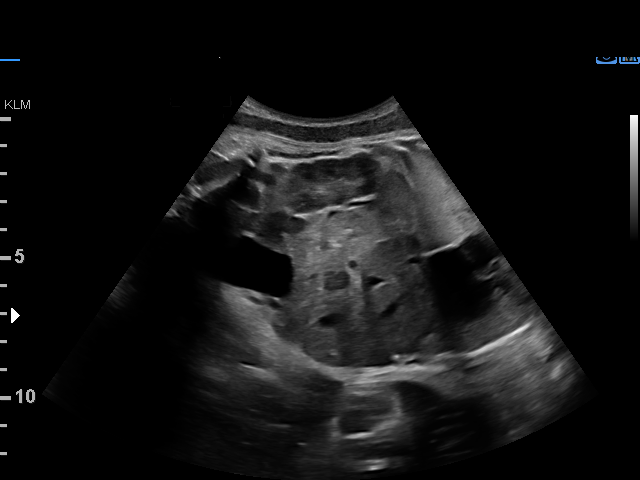
[im 11/23]
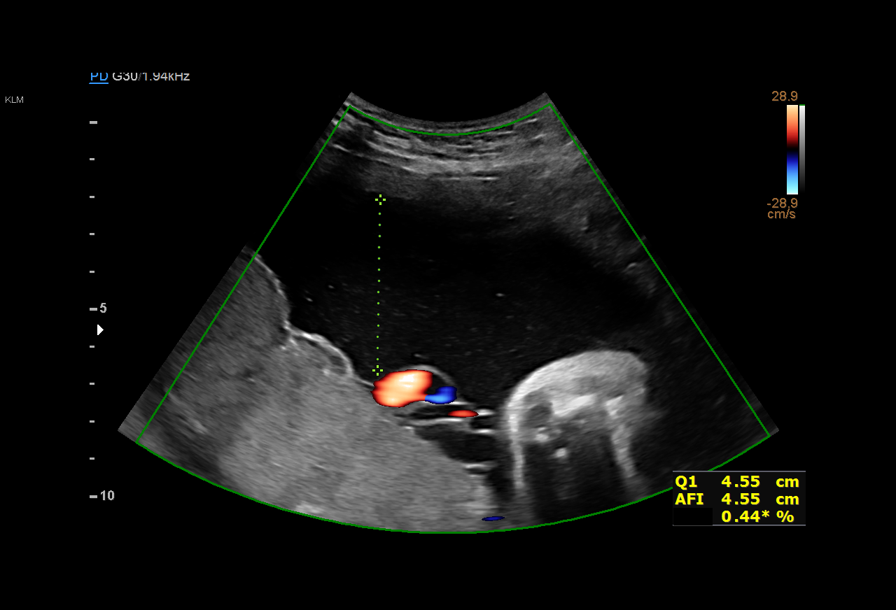
[im 13/23]
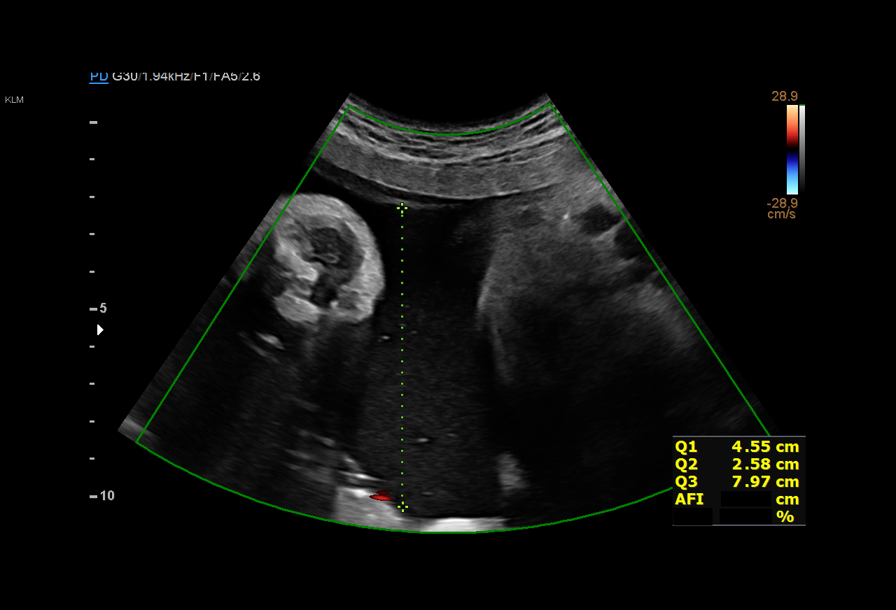
[im 15/23]
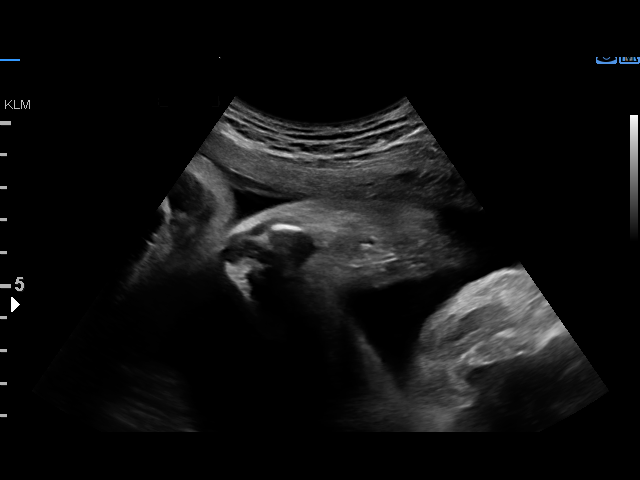
[im 17/23]
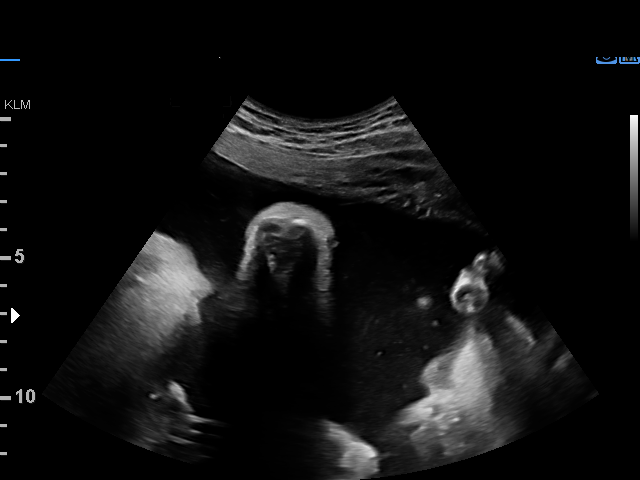
[im 19/23]
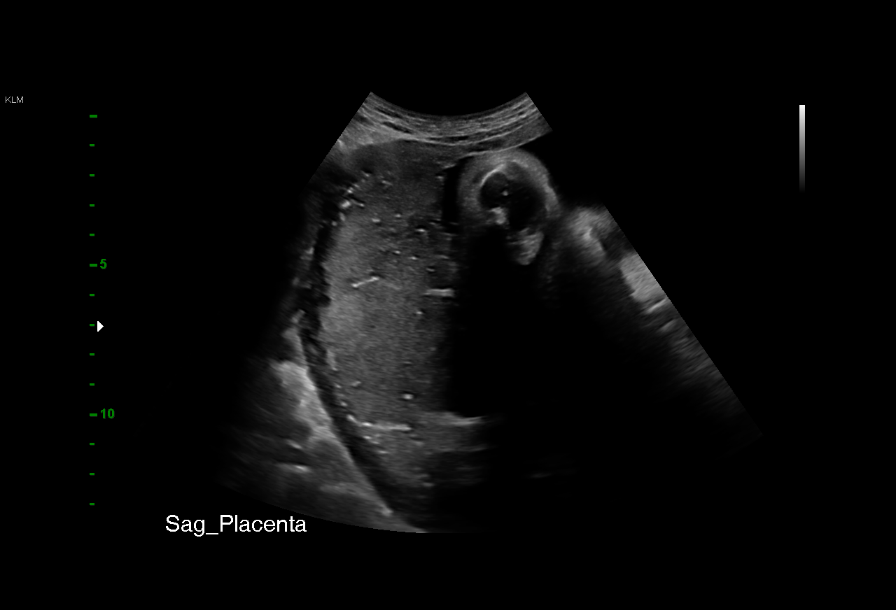
[im 21/23]
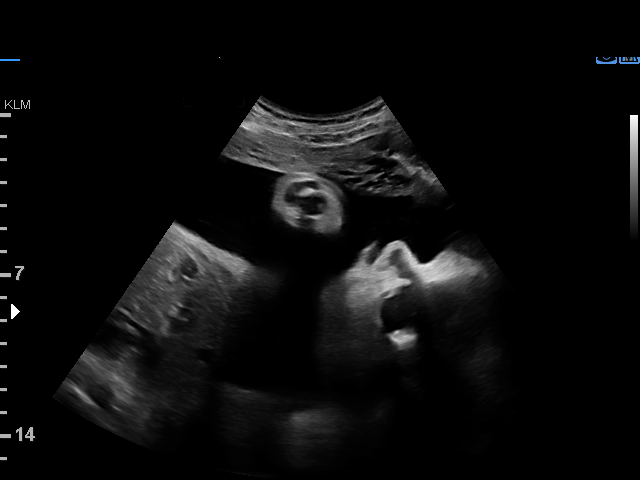
[im 23/23]
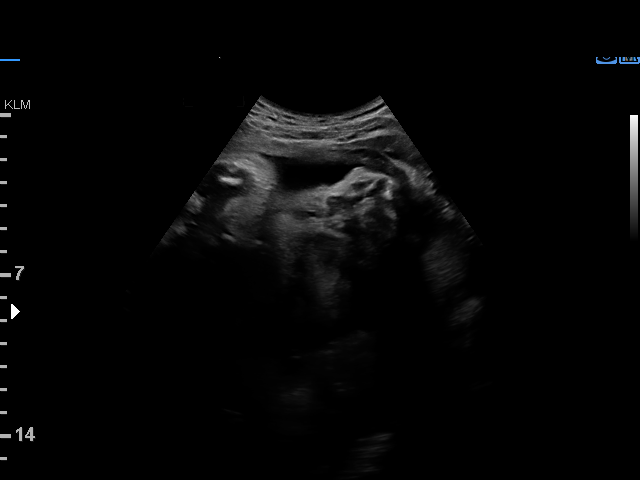

[12 of 23 positions shown; findings below may reference images not displayed]

Attending:        Soehartono Jannat      Secondary Phy.:    [facility] MAU/Triage

Indications

 Decreased fetal movement
 33 weeks gestation of pregnancy
Fetal Evaluation

 Num Of Fetuses:          1
 Fetal Heart Rate(bpm):   158
 Cardiac Activity:        Observed
 Presentation:            Cephalic
 Placenta:                Posterior
 P. Cord Insertion:       Previously Visualized

 Amniotic Fluid
 AFI FV:      Within normal limits

 AFI Sum(cm)     %Tile       Largest Pocket(cm)
 20.3            76          8

 RUQ(cm)       RLQ(cm)       LUQ(cm)        LLQ(cm)
 4.6           2.6           8
Biophysical Evaluation

 Amniotic F.V:   Within normal limits       F. Tone:         Observed
 F. Movement:    Observed                   Score:           [DATE]
 F. Breathing:   Observed
OB History

 Gravidity:    2         Term:   0        Prem:   0        SAB:   0
 TOP:          1       Ectopic:  0        Living: 0
Gestational Age

 LMP:           33w 5d        Date:  04/09/19                 EDD:   01/14/20
 Best:          33w 5d     Det. By:  LMP  (04/09/19)          EDD:   01/14/20
Anatomy

 Stomach:               Appears normal, left   Bladder:                Appears normal
                        sided
Impression

 Antenatal testing for decreased fetal movement
 Biophysical profile performed and was [DATE]
 There is good fetal movement and amniotic fluid volume.
Recommendations

 Follow up per inpatient provider

## 2021-05-22 DIAGNOSIS — Z419 Encounter for procedure for purposes other than remedying health state, unspecified: Secondary | ICD-10-CM | POA: Diagnosis not present

## 2021-06-08 ENCOUNTER — Emergency Department (HOSPITAL_BASED_OUTPATIENT_CLINIC_OR_DEPARTMENT_OTHER)
Admission: EM | Admit: 2021-06-08 | Discharge: 2021-06-08 | Disposition: A | Discharge status: Home / Self Care | Payer: Medicaid Other | Attending: Emergency Medicine | Admitting: Emergency Medicine

## 2021-06-08 ENCOUNTER — Emergency Department (HOSPITAL_BASED_OUTPATIENT_CLINIC_OR_DEPARTMENT_OTHER): Payer: Medicaid Other | Admitting: Radiology

## 2021-06-08 ENCOUNTER — Other Ambulatory Visit: Payer: Self-pay

## 2021-06-08 ENCOUNTER — Encounter (HOSPITAL_BASED_OUTPATIENT_CLINIC_OR_DEPARTMENT_OTHER): Payer: Self-pay

## 2021-06-08 DIAGNOSIS — R071 Chest pain on breathing: Secondary | ICD-10-CM | POA: Diagnosis not present

## 2021-06-08 DIAGNOSIS — Y92410 Unspecified street and highway as the place of occurrence of the external cause: Secondary | ICD-10-CM | POA: Diagnosis not present

## 2021-06-08 DIAGNOSIS — S20219A Contusion of unspecified front wall of thorax, initial encounter: Secondary | ICD-10-CM | POA: Insufficient documentation

## 2021-06-08 DIAGNOSIS — S29001A Unspecified injury of muscle and tendon of front wall of thorax, initial encounter: Secondary | ICD-10-CM | POA: Diagnosis present

## 2021-06-08 DIAGNOSIS — Z743 Need for continuous supervision: Secondary | ICD-10-CM | POA: Diagnosis not present

## 2021-06-08 DIAGNOSIS — T1490XA Injury, unspecified, initial encounter: Secondary | ICD-10-CM | POA: Diagnosis not present

## 2021-06-08 DIAGNOSIS — Z041 Encounter for examination and observation following transport accident: Secondary | ICD-10-CM | POA: Diagnosis not present
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
## 2021-06-22 DIAGNOSIS — Z419 Encounter for procedure for purposes other than remedying health state, unspecified: Secondary | ICD-10-CM | POA: Diagnosis not present

## 2021-07-20 DIAGNOSIS — Z419 Encounter for procedure for purposes other than remedying health state, unspecified: Secondary | ICD-10-CM | POA: Diagnosis not present

## 2021-08-16 ENCOUNTER — Other Ambulatory Visit: Payer: Self-pay

## 2021-08-16 ENCOUNTER — Encounter (HOSPITAL_BASED_OUTPATIENT_CLINIC_OR_DEPARTMENT_OTHER): Payer: Self-pay

## 2021-08-16 DIAGNOSIS — B3731 Acute candidiasis of vulva and vagina: Secondary | ICD-10-CM | POA: Diagnosis not present

## 2021-08-16 DIAGNOSIS — N898 Other specified noninflammatory disorders of vagina: Secondary | ICD-10-CM | POA: Diagnosis not present

## 2021-08-16 LAB — URINALYSIS, ROUTINE W REFLEX MICROSCOPIC
Bilirubin Urine: NEGATIVE
Glucose, UA: NEGATIVE mg/dL
Hgb urine dipstick: NEGATIVE
Ketones, ur: NEGATIVE mg/dL
Nitrite: NEGATIVE
Protein, ur: 30 mg/dL — AB
Specific Gravity, Urine: 1.034 — ABNORMAL HIGH (ref 1.005–1.030)
pH: 6.5 (ref 5.0–8.0)

## 2021-08-16 LAB — PREGNANCY, URINE: Preg Test, Ur: NEGATIVE

## 2021-08-16 NOTE — ED Triage Notes (Signed)
Patient here POV Home for STD Check. ? ?Patient endorses Recent Sexual Encounter 7-9 days PTA. 2 days PTA she began Vaginal Irritation and Small amount of Bloody Discharge. ? ?No Fevers. No Odorous Smell. ? ?NAD Noted during Triage. A&Ox4. GCS 15. Ambulatory. ?

## 2021-08-17 ENCOUNTER — Emergency Department (HOSPITAL_BASED_OUTPATIENT_CLINIC_OR_DEPARTMENT_OTHER)
Admission: EM | Admit: 2021-08-17 | Discharge: 2021-08-17 | Disposition: A | Discharge status: Home / Self Care | Payer: Medicaid Other | Attending: Emergency Medicine | Admitting: Emergency Medicine

## 2021-08-17 DIAGNOSIS — B3731 Acute candidiasis of vulva and vagina: Secondary | ICD-10-CM

## 2021-08-17 LAB — HIV ANTIBODY (ROUTINE TESTING W REFLEX): HIV Screen 4th Generation wRfx: NONREACTIVE

## 2021-08-17 LAB — WET PREP, GENITAL
Clue Cells Wet Prep HPF POC: NONE SEEN
Sperm: NONE SEEN
Trich, Wet Prep: NONE SEEN
WBC, Wet Prep HPF POC: >=10 — AB (ref <10)

## 2021-08-17 MED ORDER — FLUCONAZOLE 150 MG PO TABS: 150.0000 mg | ORAL_TABLET | Freq: Once | ORAL | 0 refills | Status: AC

## 2021-08-17 NOTE — ED Notes (Signed)
Pt verbalizes understanding of discharge instructions. Opportunity for questioning and answers were provided. Pt discharged from ED to home.   ? ?

## 2021-08-17 NOTE — ED Provider Notes (Signed)
?[facility] EMER[facility] DEPT ?Provider Note ? ? ?CSN: [id number] ?Arrival date & time: 08/16/21  2246 ? ?  ? ?History ? ?Chief Complaint  ?Patient presents with  ? SEXUALLY TRANSMITTED DISEASE  ? ? ?Cheyenne Medina is a 21 y.o. female. ? ?The history is provided by the patient.  ?She has noted intermittent vaginal irritation over the last 2 days, and noted some blood when she wiped on 1 occasion.  She denies any discharge.  She had had unprotected sex.  She does have a history of having had bacterial vaginosis once before.  She is concerned that she may have a sexually transmitted infection. ?  ?Home Medications ?Prior to Admission medications   ?Medication Sig Start Date End Date Taking? Authorizing Provider  ?amoxicillin-clavulanate (AUGMENTIN) 875-125 MG tablet Take 1 tablet by mouth every 12 (twelve) hours. 10/04/20   Roxy Horseman, PA-C  ?ferrous sulfate 325 (65 FE) MG EC tablet Take 1 tablet by mouth daily. 11/26/19   [provider]  ?Prenatal Vit-Fe Fumarate-FA (M-NATAL PLUS) 27-1 MG TABS Take 1 tablet by mouth at bedtime. 12/21/19   [provider]  ?   ? ?Allergies    ?Patient has no known allergies.   ? ?Review of Systems   ?Review of Systems  ?All other systems reviewed and are negative. ? ?Physical Exam ?Updated Vital Signs ?BP 126/90   Pulse 65   Temp 98.5 ?F (36.9 ?C) (Oral)   Resp 18   Ht 5' (1.524 m)   Wt 49.9 kg   SpO2 100%   BMI 21.48 kg/m?  ?Physical Exam ?Vitals and nursing note reviewed.  ?21 year old female, resting comfortably and in no acute distress. Vital signs are normal. Oxygen saturation is 100%, which is normal. ?Head is normocephalic and atraumatic. PERRLA, EOMI. Oropharynx is clear. ?Neck is nontender and supple without adenopathy. ?Lungs are clear without rales, wheezes, or rhonchi. ?Chest is nontender. ?Heart has regular rate and rhythm without murmur. ?Abdomen is soft, flat, nontender. ?Pelvic: Normal external female genitalia.  Moderate green  discharge present.  Cervix is closed.  Fundus is normal size and position.  There is no cervical motion tenderness.  There is no adnexal masses or tenderness. ?Extremities have no cyanosis or edema, full range of motion is present. ?Skin is warm and dry without rash. ?Neurologic: Mental status is normal, cranial nerves are intact, moves all extremities equally. ? ? ?ED Results / Procedures / Treatments   ?Labs ?(all labs ordered are listed, but only abnormal results are displayed) ?Labs Reviewed  ?WET PREP, GENITAL - Abnormal; Notable for the following components:  ?    Result Value  ? Yeast Wet Prep HPF POC PRESENT (*)   ? WBC, Wet Prep HPF POC >=10 (*)   ? All other components within normal limits  ?URINALYSIS, ROUTINE W REFLEX MICROSCOPIC - Abnormal; Notable for the following components:  ? Specific Gravity, Urine 1.034 (*)   ? Protein, ur 30 (*)   ? Leukocytes,Ua SMALL (*)   ? All other components within normal limits  ?PREGNANCY, URINE  ?RPR  ?HIV ANTIBODY (ROUTINE TESTING W REFLEX)  ?GC/CHLAMYDIA PROBE AMP ([facility]) NOT AT [facility]  ? ?Procedures ?Procedures  ? ? ?Medications Ordered in ED ?Medications - No data to display ? ?ED Course/ Medical Decision Making/ A&P ?  ?                        ?Medical Decision Making ?Amount and/or  Complexity of Data Reviewed ?Labs: ordered. ? ?Risk ?Prescription drug management. ? ? ?Vaginal irritation with discharge.  Sexually transmitted infection panel is sent.  Old records are reviewed, and she did have a prior episode of monilial vaginitis. ? ?Wet prep is positive for yeast.  She is discharged with a prescription for fluconazole. ? ?Final Clinical Impression(s) / ED Diagnoses ?Final diagnoses:  ?Monilial vaginitis  ? ? ?Rx / DC Orders ?ED Discharge Orders   ? ?      Ordered  ?  fluconazole (DIFLUCAN) 150 MG tablet   Once       ? 08/17/21 0301  ? ?  ?  ? ?  ? ? ?  ?Dione Booze, MD ?08/17/21 8841 ? ?

## 2021-08-17 NOTE — Discharge Instructions (Addendum)
You can check for the results of your cultures and blood tests on MyChart. ?

## 2021-08-18 LAB — GC/CHLAMYDIA PROBE AMP (~~LOC~~) NOT AT ARMC
Chlamydia: NEGATIVE
Comment: NEGATIVE
Comment: NORMAL
Neisseria Gonorrhea: NEGATIVE

## 2021-08-18 LAB — RPR: RPR Ser Ql: NONREACTIVE

## 2021-08-20 DIAGNOSIS — Z419 Encounter for procedure for purposes other than remedying health state, unspecified: Secondary | ICD-10-CM | POA: Diagnosis not present

## 2021-09-19 DIAGNOSIS — Z419 Encounter for procedure for purposes other than remedying health state, unspecified: Secondary | ICD-10-CM | POA: Diagnosis not present

## 2021-10-20 DIAGNOSIS — Z419 Encounter for procedure for purposes other than remedying health state, unspecified: Secondary | ICD-10-CM | POA: Diagnosis not present

## 2021-11-10 DIAGNOSIS — R5383 Other fatigue: Secondary | ICD-10-CM | POA: Diagnosis not present

## 2021-11-10 DIAGNOSIS — N760 Acute vaginitis: Secondary | ICD-10-CM | POA: Diagnosis not present

## 2021-11-10 DIAGNOSIS — N898 Other specified noninflammatory disorders of vagina: Secondary | ICD-10-CM | POA: Diagnosis not present

## 2021-11-10 DIAGNOSIS — Z3202 Encounter for pregnancy test, result negative: Secondary | ICD-10-CM | POA: Diagnosis not present

## 2021-11-19 DIAGNOSIS — Z419 Encounter for procedure for purposes other than remedying health state, unspecified: Secondary | ICD-10-CM | POA: Diagnosis not present

## 2021-12-20 DIAGNOSIS — Z419 Encounter for procedure for purposes other than remedying health state, unspecified: Secondary | ICD-10-CM | POA: Diagnosis not present

## 2022-01-20 DIAGNOSIS — Z419 Encounter for procedure for purposes other than remedying health state, unspecified: Secondary | ICD-10-CM | POA: Diagnosis not present

## 2022-02-19 DIAGNOSIS — Z419 Encounter for procedure for purposes other than remedying health state, unspecified: Secondary | ICD-10-CM | POA: Diagnosis not present

## 2022-03-22 DIAGNOSIS — Z419 Encounter for procedure for purposes other than remedying health state, unspecified: Secondary | ICD-10-CM | POA: Diagnosis not present

## 2022-04-21 DIAGNOSIS — Z419 Encounter for procedure for purposes other than remedying health state, unspecified: Secondary | ICD-10-CM | POA: Diagnosis not present

## 2022-05-22 DIAGNOSIS — Z419 Encounter for procedure for purposes other than remedying health state, unspecified: Secondary | ICD-10-CM | POA: Diagnosis not present

## 2022-05-31 ENCOUNTER — Ambulatory Visit
Admission: EM | Admit: 2022-05-31 | Discharge: 2022-05-31 | Disposition: A | Discharge status: Home / Self Care | Payer: Medicaid Other | Attending: Urgent Care | Admitting: Urgent Care

## 2022-05-31 DIAGNOSIS — N760 Acute vaginitis: Secondary | ICD-10-CM | POA: Diagnosis not present

## 2022-05-31 DIAGNOSIS — Z7251 High risk heterosexual behavior: Secondary | ICD-10-CM | POA: Insufficient documentation

## 2022-05-31 DIAGNOSIS — B9689 Other specified bacterial agents as the cause of diseases classified elsewhere: Secondary | ICD-10-CM

## 2022-05-31 DIAGNOSIS — B3731 Acute candidiasis of vulva and vagina: Secondary | ICD-10-CM | POA: Insufficient documentation

## 2022-05-31 LAB — POCT URINE PREGNANCY: Preg Test, Ur: NEGATIVE

## 2022-05-31 MED ORDER — METRONIDAZOLE 500 MG PO TABS: 500.0000 mg | ORAL_TABLET | Freq: Two times a day (BID) | ORAL | 0 refills | Status: DC

## 2022-05-31 MED ORDER — FLUCONAZOLE 150 MG PO TABS: 150.0000 mg | ORAL_TABLET | ORAL | 0 refills | Status: DC

## 2022-05-31 NOTE — ED Provider Notes (Signed)
[facility]  Note:  This document was prepared using Systems analyst and may include unintentional dictation errors.  MRN: [id number] DOB: Apr 01, 2001  Subjective:   Cheyenne Medina is a 22 y.o. female presenting for 2-3 day history of vaginal irritation, vaginal discharge. Denies fever, n/v, abdominal pain, pelvic pain, rashes, dysuria, urinary frequency, hematuria.  Has a new partner since October and wants to be checked to be sure.  Last HIV and syphilis check was in June, was negative.  No current facility-administered medications for this encounter.  Current Outpatient Medications:    ferrous sulfate 325 (65 FE) MG EC tablet, Take 1 tablet by mouth daily., Disp: , Rfl:    Prenatal Vit-Fe Fumarate-FA (M-NATAL PLUS) 27-1 MG TABS, Take 1 tablet by mouth at bedtime., Disp: , Rfl:    No Known Allergies  History reviewed. No pertinent past medical history.   Past Surgical History:  Procedure Laterality Date   DILATION AND CURETTAGE OF UTERUS      No family history on file.  Social History   Tobacco Use   Smoking status: Never    Passive exposure: Never   Smokeless tobacco: Never  Vaping Use   Vaping Use: Never used  Substance Use Topics   Alcohol use: Not Currently   Drug use: Never    ROS   Objective:   Vitals: BP (!) 142/79 (BP Location: Left Arm)   Pulse 80   Temp 99.2 F (37.3 C) (Oral)   Resp 16   LMP 05/10/2022   SpO2 98%   Physical Exam Constitutional:      General: She is not in acute distress.    Appearance: Normal appearance. She is well-developed. She is not ill-appearing, toxic-appearing or diaphoretic.  HENT:     Head: Normocephalic and atraumatic.     Nose: Nose normal.     Mouth/Throat:     Mouth: Mucous membranes are moist.     Pharynx: Oropharynx is clear.  Eyes:     General: No scleral icterus.       Right eye: No discharge.        Left eye: No discharge.     Extraocular Movements:  Extraocular movements intact.     Conjunctiva/sclera: Conjunctivae normal.  Cardiovascular:     Rate and Rhythm: Normal rate.  Pulmonary:     Effort: Pulmonary effort is normal.  Abdominal:     General: Bowel sounds are normal. There is no distension.     Palpations: Abdomen is soft. There is no mass.     Tenderness: There is no abdominal tenderness. There is no right CVA tenderness, left CVA tenderness, guarding or rebound.  Skin:    General: Skin is warm and dry.  Neurological:     General: No focal deficit present.     Mental Status: She is alert and oriented to person, place, and time.  Psychiatric:        Mood and Affect: Mood normal.        Behavior: Behavior normal.        Thought Content: Thought content normal.        Judgment: Judgment normal.    Results for orders placed or performed during the hospital encounter of 05/31/22 (from the past 24 hour(s))  POCT urine pregnancy     Status: None   Collection Time: 05/31/22  3:30 PM  Result Value Ref Range   Preg Test, Ur Negative Negative    Assessment and  Plan :   PDMP not reviewed this encounter.  1. Bacterial vaginosis   2. Yeast vaginitis     We will treat patient empirically for bacterial vaginosis with Flagyl and for yeast vaginitis with fluconazole.  Labs pending. Counseled patient on potential for adverse effects with medications prescribed/recommended today, ER and return-to-clinic precautions discussed, patient verbalized understanding.    Jaynee Eagles, [place] 05/31/22 1537

## 2022-05-31 NOTE — ED Triage Notes (Signed)
Pt c/o vaginal d/c x 2-3 days-NAD-steady gait 

## 2022-06-01 LAB — CERVICOVAGINAL ANCILLARY ONLY
Bacterial Vaginitis (gardnerella): NEGATIVE
Candida Glabrata: NEGATIVE
Candida Vaginitis: POSITIVE — AB
Chlamydia: NEGATIVE
Comment: NEGATIVE
Comment: NEGATIVE
Comment: NEGATIVE
Comment: NEGATIVE
Comment: NEGATIVE
Comment: NORMAL
Neisseria Gonorrhea: NEGATIVE
Trichomonas: NEGATIVE

## 2022-06-01 LAB — HIV ANTIBODY (ROUTINE TESTING W REFLEX): HIV Screen 4th Generation wRfx: NONREACTIVE

## 2022-06-01 LAB — RPR: RPR Ser Ql: NONREACTIVE

## 2022-06-22 DIAGNOSIS — Z419 Encounter for procedure for purposes other than remedying health state, unspecified: Secondary | ICD-10-CM | POA: Diagnosis not present

## 2022-06-23 DIAGNOSIS — R110 Nausea: Secondary | ICD-10-CM | POA: Diagnosis not present

## 2022-06-23 DIAGNOSIS — Z20822 Contact with and (suspected) exposure to covid-19: Secondary | ICD-10-CM | POA: Diagnosis not present

## 2022-06-23 DIAGNOSIS — R197 Diarrhea, unspecified: Secondary | ICD-10-CM | POA: Diagnosis not present

## 2022-06-23 DIAGNOSIS — R5081 Fever presenting with conditions classified elsewhere: Secondary | ICD-10-CM | POA: Diagnosis not present

## 2022-06-23 DIAGNOSIS — A09 Infectious gastroenteritis and colitis, unspecified: Secondary | ICD-10-CM | POA: Diagnosis not present

## 2022-07-11 DIAGNOSIS — E559 Vitamin D deficiency, unspecified: Secondary | ICD-10-CM | POA: Diagnosis not present

## 2022-07-11 DIAGNOSIS — Z130 Encounter for screening for diseases of the blood and blood-forming organs and certain disorders involving the immune mechanism: Secondary | ICD-10-CM | POA: Diagnosis not present

## 2022-07-11 DIAGNOSIS — Z13220 Encounter for screening for lipoid disorders: Secondary | ICD-10-CM | POA: Diagnosis not present

## 2022-07-11 DIAGNOSIS — Z0000 Encounter for general adult medical examination without abnormal findings: Secondary | ICD-10-CM | POA: Diagnosis not present

## 2022-07-11 DIAGNOSIS — Z1329 Encounter for screening for other suspected endocrine disorder: Secondary | ICD-10-CM | POA: Diagnosis not present

## 2022-07-11 DIAGNOSIS — Z131 Encounter for screening for diabetes mellitus: Secondary | ICD-10-CM | POA: Diagnosis not present

## 2022-07-11 DIAGNOSIS — Z113 Encounter for screening for infections with a predominantly sexual mode of transmission: Secondary | ICD-10-CM | POA: Diagnosis not present

## 2022-07-11 DIAGNOSIS — Z1389 Encounter for screening for other disorder: Secondary | ICD-10-CM | POA: Diagnosis not present

## 2022-07-21 DIAGNOSIS — Z419 Encounter for procedure for purposes other than remedying health state, unspecified: Secondary | ICD-10-CM | POA: Diagnosis not present

## 2022-08-21 DIAGNOSIS — Z419 Encounter for procedure for purposes other than remedying health state, unspecified: Secondary | ICD-10-CM | POA: Diagnosis not present

## 2022-09-20 DIAGNOSIS — Z419 Encounter for procedure for purposes other than remedying health state, unspecified: Secondary | ICD-10-CM | POA: Diagnosis not present

## 2022-10-05 IMAGING — CT CT MAXILLOFACIAL W/O CM
3 series · 16 of 47 positions shown, 19 images · non-contrast
Comparison: None

CLINICAL DATA: Fell striking RIGHT side of jaw on table 1 month
ago, onset of pain this week on RIGHT

EXAM:
CT MAXILLOFACIAL WITHOUT CONTRAST
TECHNIQUE: Multidetector CT imaging of the maxillofacial structures was
performed. Multiplanar CT image reconstructions were also generated.
Right side of face marked with BB.

[Series 2: 1 max soft · axial · 0.32mm/px · z∈[-174,-52]mm · 10 of 71 slices shown, 13 images]
[im 5/71  brain]
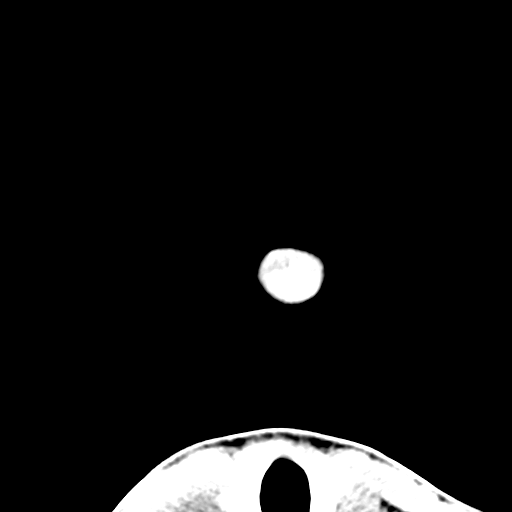
[im 5/71  bone]
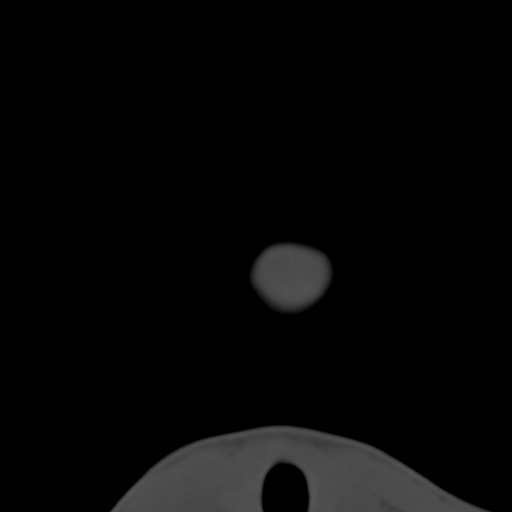
[im 13/71  bone]
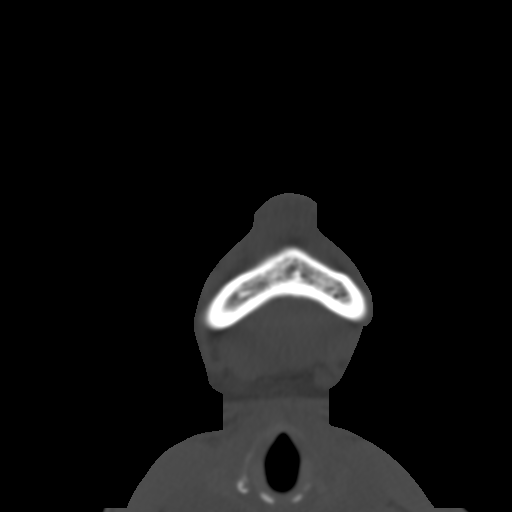
[im 20/71  bone]
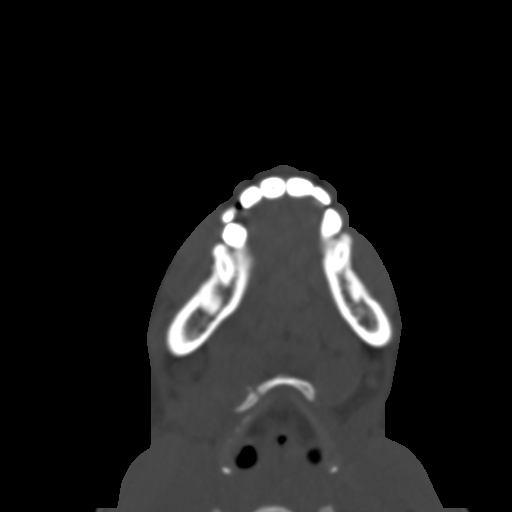
[im 25/71  bone]
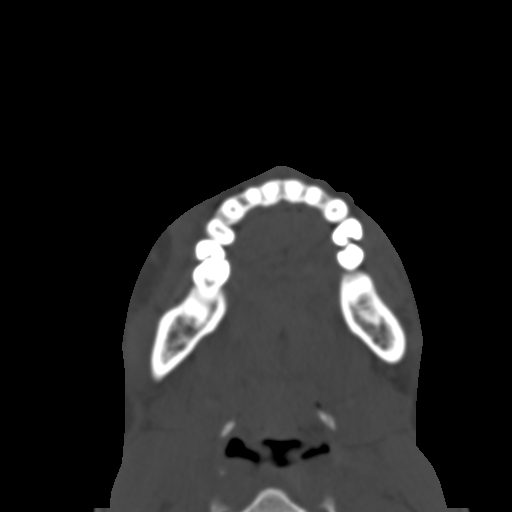
[im 32/71  brain]
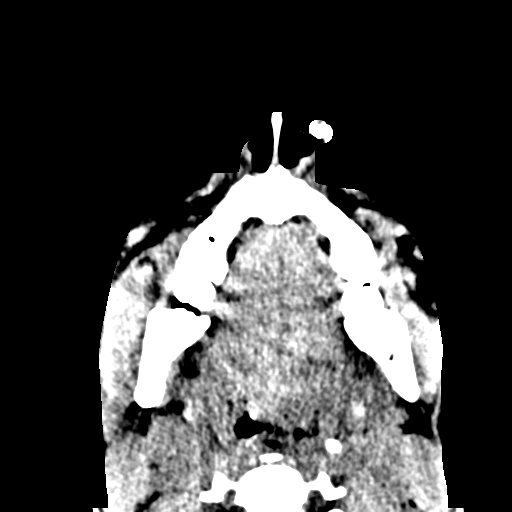
[im 32/71  bone]
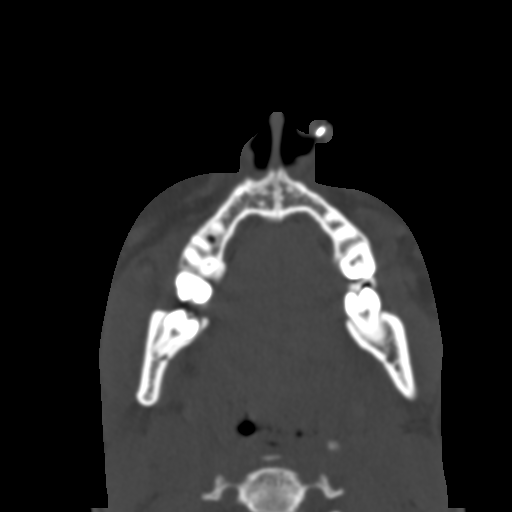
[im 39/71  bone]
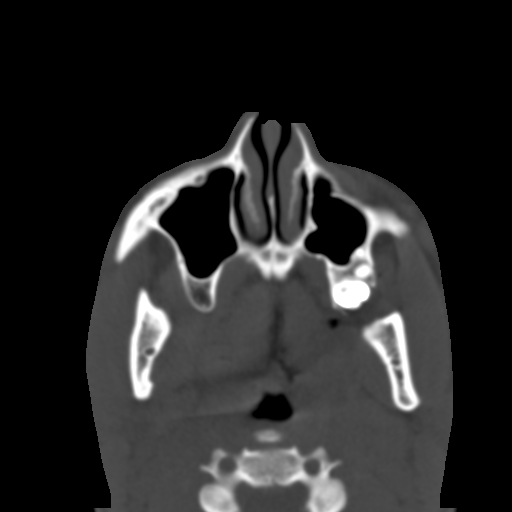
[im 46/71  bone]
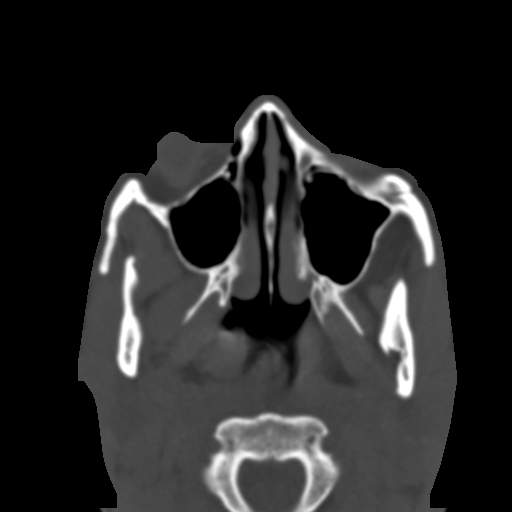
[im 54/71  bone]
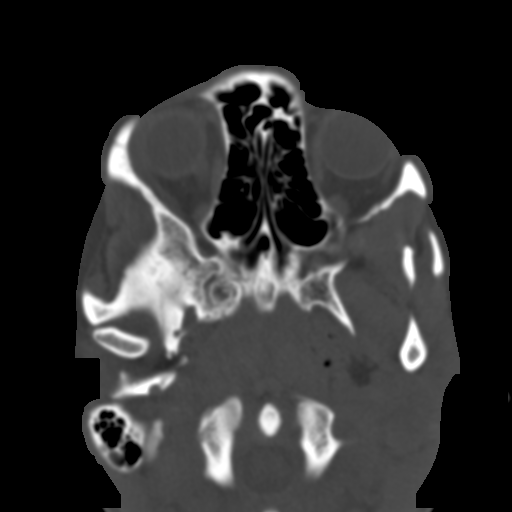
[im 58/71  brain]
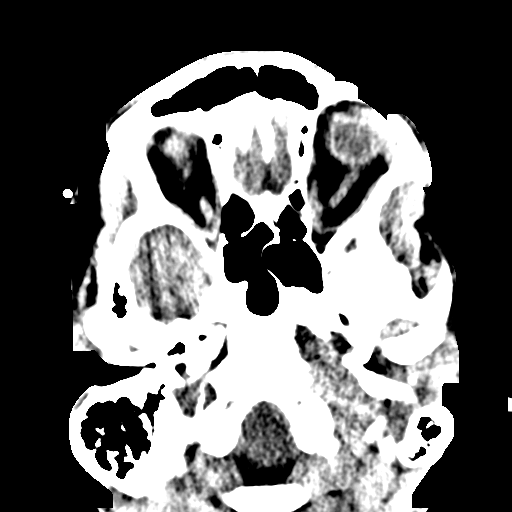
[im 58/71  bone]
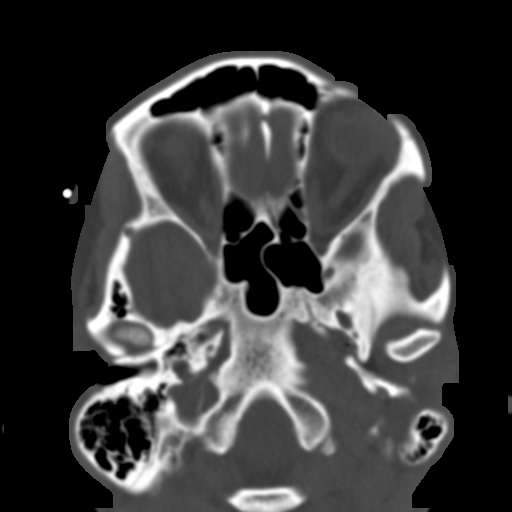
[im 66/71  bone]
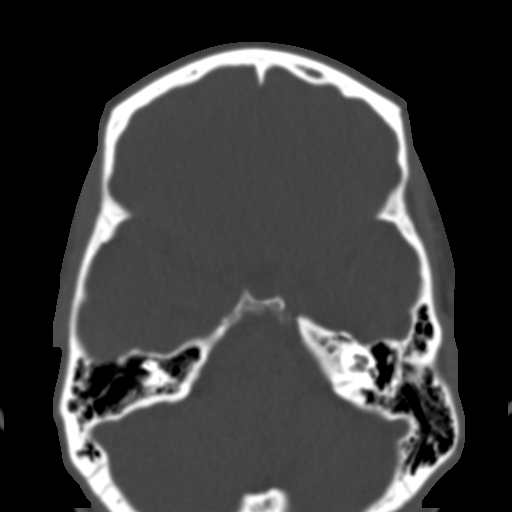

[Series 6: coronal soft · coronal · 0.37mm/px · 3 of 76 slices shown]
[im 26/76  bone]
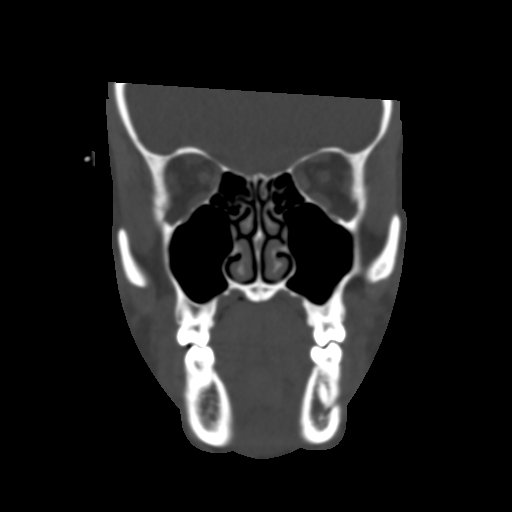
[im 34/76  bone]
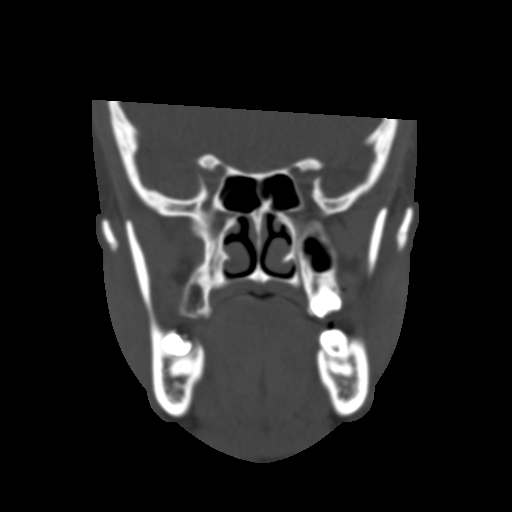
[im 42/76  bone]
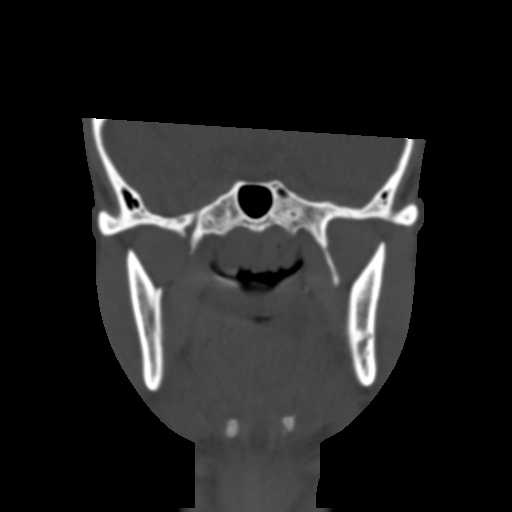

[Series 8: sagittal soft · sagittal · 0.29mm/px · 3 of 84 slices shown]
[im 28/84  bone]
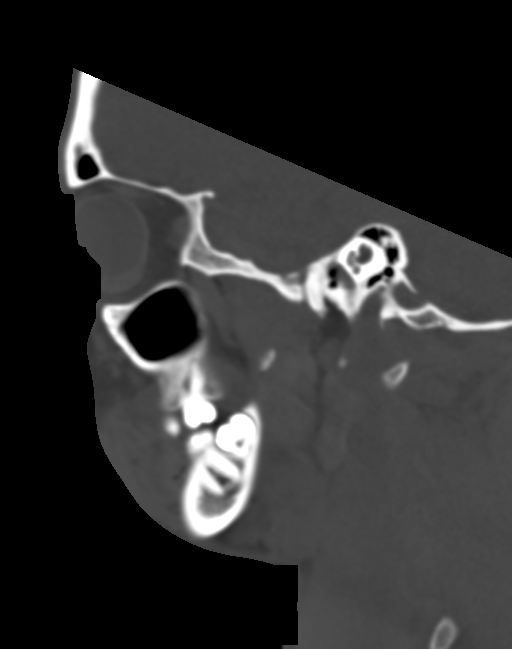
[im 42/84  bone]
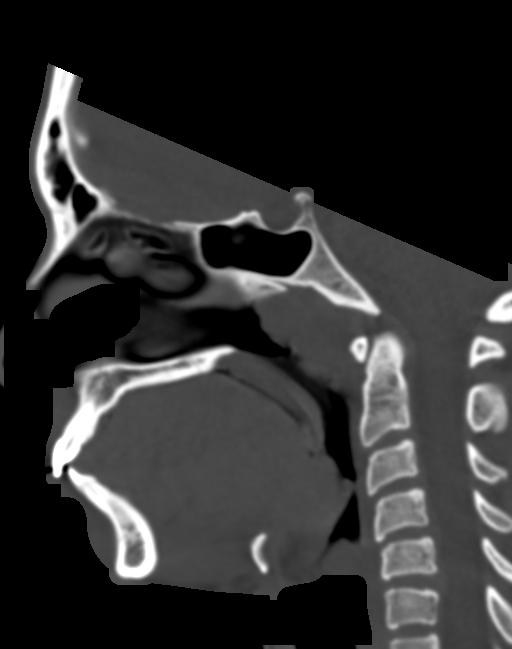
[im 56/84  bone]
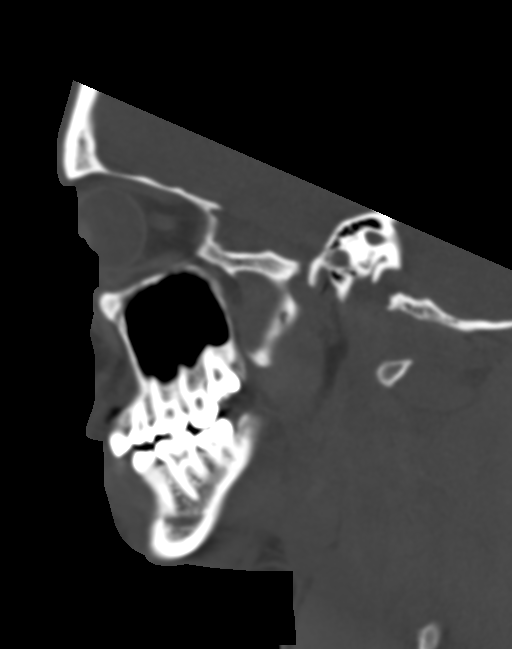

[16 of 47 positions shown; findings below may reference images not displayed]

FINDINGS: Osseous: TMJ alignment normal bilaterally.  Facial bones intact.

Orbits: Intraorbital soft tissue planes clear. No orbital
pneumatosis or fluid.

Sinuses: Paranasal sinuses, mastoid air cells, and middle ear
cavities clear

Soft tissues: Unremarkable

Limited intracranial: Unremarkable
IMPRESSION: Normal exam.

## 2022-10-21 DIAGNOSIS — Z419 Encounter for procedure for purposes other than remedying health state, unspecified: Secondary | ICD-10-CM | POA: Diagnosis not present

## 2022-11-17 ENCOUNTER — Ambulatory Visit
Admission: EM | Admit: 2022-11-17 | Discharge: 2022-11-17 | Disposition: A | Discharge status: Home / Self Care | Payer: Medicaid Other | Attending: Internal Medicine | Admitting: Internal Medicine

## 2022-11-17 DIAGNOSIS — N760 Acute vaginitis: Secondary | ICD-10-CM | POA: Insufficient documentation

## 2022-11-17 DIAGNOSIS — N898 Other specified noninflammatory disorders of vagina: Secondary | ICD-10-CM

## 2022-11-17 LAB — POCT URINALYSIS DIP (MANUAL ENTRY)
Bilirubin, UA: NEGATIVE
Blood, UA: NEGATIVE
Glucose, UA: NEGATIVE mg/dL
Ketones, POC UA: NEGATIVE mg/dL
Leukocytes, UA: NEGATIVE
Nitrite, UA: NEGATIVE
Protein Ur, POC: NEGATIVE mg/dL
Spec Grav, UA: 1.02 (ref 1.010–1.025)
Urobilinogen, UA: 1 E.U./dL
pH, UA: 7 (ref 5.0–8.0)

## 2022-11-17 LAB — POCT URINE PREGNANCY: Preg Test, Ur: NEGATIVE

## 2022-11-17 MED ORDER — METRONIDAZOLE 500 MG PO TABS
500.0000 mg | ORAL_TABLET | Freq: Two times a day (BID) | ORAL | 0 refills | Status: DC
Start: 2022-11-17 — End: 2022-12-13

## 2022-11-17 NOTE — ED Triage Notes (Signed)
Pt presents with c/o vaginal discharge x 1 month.   Pt is requesting STD testing.   Denies urinary issues, pain and burning.

## 2022-11-17 NOTE — ED Provider Notes (Addendum)
[facility]    CSN: [id number] Arrival date & time: 11/17/22  0847      History   Chief Complaint Chief Complaint  Patient presents with   SEXUALLY TRANSMITTED DISEASE    HPI Cheyenne Medina is a 22 y.o. female presents for evaluation of discharge.  Patient reports 3 to 4 weeks of slightly malodorous nonpruritic vaginal discharge.  Reports history of BV and states his discharge/symptoms are consistent with previous BV infections.  Denies any dysuria, itching fevers, nausea/vomiting, flank pain.  No known STD exposure but would like screening while here.  No OTC medications have been used for symptoms since onset.  No other concerns at this time.  HPI  History reviewed. No pertinent past medical history.  Patient Active Problem List   Diagnosis Date Noted   Normal labor and delivery 12/22/2019    Past Surgical History:  Procedure Laterality Date   DILATION AND CURETTAGE OF UTERUS      OB History     Gravida  2   Para  1   Term      Preterm  1   AB  1   Living  1      SAB      IAB      Ectopic      Multiple  0   Live Births  1            Home Medications    Prior to Admission medications   Medication Sig Start Date End Date Taking? Authorizing Provider  metroNIDAZOLE (FLAGYL) 500 MG tablet Take 1 tablet (500 mg total) by mouth 2 (two) times daily. 11/17/22  Yes Radford Pax, NP  ferrous sulfate 325 (65 FE) MG EC tablet Take 1 tablet by mouth daily. 11/26/19   [provider]  fluconazole (DIFLUCAN) 150 MG tablet Take 1 tablet (150 mg total) by mouth once a week. 05/31/22   Wallis Bamberg, PA-C  Prenatal Vit-Fe Fumarate-FA (M-NATAL PLUS) 27-1 MG TABS Take 1 tablet by mouth at bedtime. 12/21/19   [provider]    Family History History reviewed. No pertinent family history.  Social History Social History   Tobacco Use   Smoking status: Never    Passive exposure: Never   Smokeless tobacco: Never  Vaping Use    Vaping Use: Never used  Substance Use Topics   Alcohol use: Not Currently   Drug use: Never     Allergies   Patient has no known allergies.   Review of Systems Review of Systems  Genitourinary:  Positive for vaginal discharge.     Physical Exam Triage Vital Signs ED Triage Vitals [11/17/22 0909]  Enc Vitals Group     BP 117/77     Pulse Rate 68     Resp 18     Temp 98.8 F (37.1 C)     Temp Source Oral     SpO2 98 %     Weight      Height      Head Circumference      Peak Flow      Pain Score 0     Pain Loc      Pain Edu?      Excl. in GC?    No data found.  Updated Vital Signs BP 117/77 (BP Location: Left Arm)   Pulse 68   Temp 98.8 F (37.1 C) (Oral)   Resp 18   LMP 10/30/2022 (Exact Date)   SpO2  98%   Visual Acuity Right Eye Distance:   Left Eye Distance:   Bilateral Distance:    Right Eye Near:   Left Eye Near:    Bilateral Near:     Physical Exam Vitals and nursing note reviewed.  Constitutional:      Appearance: Normal appearance.  HENT:     Head: Normocephalic and atraumatic.  Eyes:     Pupils: Pupils are equal, round, and reactive to light.  Cardiovascular:     Rate and Rhythm: Normal rate.  Pulmonary:     Effort: Pulmonary effort is normal.  Abdominal:     Tenderness: There is no right CVA tenderness or left CVA tenderness.  Skin:    General: Skin is warm and dry.  Neurological:     General: No focal deficit present.     Mental Status: She is alert and oriented to person, place, and time.  Psychiatric:        Mood and Affect: Mood normal.        Behavior: Behavior normal.      UC Treatments / Results  Labs (all labs ordered are listed, but only abnormal results are displayed) Labs Reviewed  POCT URINALYSIS DIP (MANUAL ENTRY)  POCT URINE PREGNANCY  CERVICOVAGINAL ANCILLARY ONLY    EKG   Radiology No results found.  Procedures Procedures (including critical care time)  Medications Ordered in UC Medications -  No data to display  Initial Impression / Assessment and Plan / UC Course  I have reviewed the triage vital signs and the nursing notes.  Pertinent labs & imaging results that were available during my care of the patient were reviewed by me and considered in my medical decision making (see chart for details).     Reviewed exam and symptoms with patient.  No red flags.  UA and hCG negative.  Vaginal swabs as ordered and will contact for any positive results.  Declined blood work as she recently had it in February.  Will treat for presumed BV based on symptoms with Flagyl.  Side effect profile reviewed.  PCP follow-up or GYN follow-up if symptoms do not improve.  ER precautions reviewed and patient verbalized understanding. Final Clinical Impressions(s) / UC Diagnoses   Final diagnoses:  Vaginal discharge  Normal labor and delivery  Acute vaginitis     Discharge Instructions      Start Flagyl twice daily for 7 days for presumed bacterial vaginosis infection.  The clinic will contact you with results of the testing done today if positive.  Please follow-up with your PCP or gynecologist if symptoms or not improving.  Please go to the emergency room if you develop any worsening symptoms.  I hope you feel better soon!   ED Prescriptions     Medication Sig Dispense Auth. Provider   metroNIDAZOLE (FLAGYL) 500 MG tablet Take 1 tablet (500 mg total) by mouth 2 (two) times daily. 14 tablet Radford Pax, NP      PDMP not reviewed this encounter.   Radford Pax, NP 11/17/22 0933    Radford Pax, NP 11/17/22 [phone]

## 2022-11-17 NOTE — Discharge Instructions (Addendum)
Start Flagyl twice daily for 7 days for presumed bacterial vaginosis infection.  The clinic will contact you with results of the testing done today if positive.  Please follow-up with your PCP or gynecologist if symptoms or not improving.  Please go to the emergency room if you develop any worsening symptoms.  I hope you feel better soon!

## 2022-11-20 DIAGNOSIS — Z419 Encounter for procedure for purposes other than remedying health state, unspecified: Secondary | ICD-10-CM | POA: Diagnosis not present

## 2022-11-20 LAB — CERVICOVAGINAL ANCILLARY ONLY
Bacterial Vaginitis (gardnerella): NEGATIVE
Candida Glabrata: NEGATIVE
Candida Vaginitis: NEGATIVE
Chlamydia: NEGATIVE
Comment: NEGATIVE
Comment: NEGATIVE
Comment: NEGATIVE
Comment: NEGATIVE
Comment: NEGATIVE
Comment: NORMAL
Neisseria Gonorrhea: NEGATIVE
Trichomonas: NEGATIVE

## 2022-11-22 ENCOUNTER — Ambulatory Visit
Admission: EM | Admit: 2022-11-22 | Discharge: 2022-11-22 | Disposition: A | Discharge status: Home / Self Care | Payer: Medicaid Other | Attending: Family Medicine | Admitting: Family Medicine

## 2022-11-22 DIAGNOSIS — Z113 Encounter for screening for infections with a predominantly sexual mode of transmission: Secondary | ICD-10-CM

## 2022-11-22 NOTE — ED Provider Notes (Signed)
[facility]    CSN: [id number] Arrival date & time: 11/22/22  1548      History   Chief Complaint Chief Complaint  Patient presents with   STI Testing    HPI Cheyenne Medina is a 22 y.o. female.   HPI Patient in today for RPR and HIV testing.  She was seen in a different urgent care on 11/17/22 and had vaginal cytology completed screening for chlamydia, gonorrhea, BV, trichomonas, and candidiasis which were all negative.  She reports she got concerned and she notes she has not had blood work done in a while.  She denies any known exposures. Patient's last menstrual period was 10/30/2022 (exact date).   History reviewed. No pertinent past medical history.  Patient Active Problem List   Diagnosis Date Noted   Normal labor and delivery 12/22/2019    Past Surgical History:  Procedure Laterality Date   DILATION AND CURETTAGE OF UTERUS      OB History     Gravida  2   Para  1   Term      Preterm  1   AB  1   Living  1      SAB      IAB      Ectopic      Multiple  0   Live Births  1            Home Medications    Prior to Admission medications   Medication Sig Start Date End Date Taking? Authorizing Provider  azithromycin (ZITHROMAX) 500 MG tablet SMARTSIG:1 Tablet(s) By Mouth 06/23/22  Yes [provider]  ibuprofen (ADVIL) 800 MG tablet Take by mouth. 01/21/19  Yes [provider]  ondansetron (ZOFRAN-ODT) 4 MG disintegrating tablet Take 4 mg by mouth every 8 (eight) hours as needed. 06/23/22  Yes [provider]  Prenat-FeAsp-Meth-FA-DHA w/o A (PRENATE PIXIE) 10-0.6-0.4-200 MG CAPS Prenate Pixie 10 mg iron-1 mg-200 mg capsule TAKE 1 CAPSULE BY MOUTH ONCE DAILY 01/15/19  Yes [provider]  Cholecalciferol 1.25 MG (50000 UT) capsule Take 1 capsule by mouth once a week.    [provider]  ferrous sulfate 325 (65 FE) MG EC tablet Take 1 tablet by mouth daily. 11/26/19   [provider]   fluconazole (DIFLUCAN) 150 MG tablet Take 1 tablet (150 mg total) by mouth once a week. 05/31/22   Wallis Bamberg, PA-C  medroxyPROGESTERone Acetate 150 MG/ML SUSY medroxyprogesterone 150 mg/mL intramuscular syringe  Inject 1 mL every 3 months by intramuscular route.    [provider]  metroNIDAZOLE (FLAGYL) 500 MG tablet Take 1 tablet (500 mg total) by mouth 2 (two) times daily. 11/17/22   Radford Pax, NP  metroNIDAZOLE (FLAGYL) 500 MG tablet Take 1 tablet every 12 hours by oral route for 7 days.    [provider]  Prenatal Vit-Fe Fumarate-FA (M-NATAL PLUS) 27-1 MG TABS Take 1 tablet by mouth at bedtime. 12/21/19   [provider]    Family History History reviewed. No pertinent family history.  Social History Social History   Tobacco Use   Smoking status: Never    Passive exposure: Never   Smokeless tobacco: Never  Vaping Use   Vaping Use: Never used  Substance Use Topics   Alcohol use: Not Currently   Drug use: Never     Allergies   Patient has no known allergies.   Review of Systems Review of Systems Pertinent negatives listed in HPI  Physical Exam Triage Vital Signs ED Triage Vitals  Enc Vitals Group     BP 11/22/22 1617 124/79     Pulse Rate 11/22/22 1617 72     Resp 11/22/22 1617 16     Temp 11/22/22 1617 99.2 F (37.3 C)     Temp Source 11/22/22 1617 Oral     SpO2 11/22/22 1617 100 %     Weight 11/22/22 1615 113 lb (51.3 kg)     Height 11/22/22 1615 5' (1.524 m)     Head Circumference --      Peak Flow --      Pain Score 11/22/22 1615 0     Pain Loc --      Pain Edu? --      Excl. in GC? --    No data found.  Updated Vital Signs BP 124/79 (BP Location: Left Arm)   Pulse 72   Temp 99.2 F (37.3 C) (Oral)   Resp 16   Ht 5' (1.524 m)   Wt 113 lb (51.3 kg)   LMP 10/30/2022 (Exact Date)   SpO2 100%   BMI 22.07 kg/m   Visual Acuity Right Eye Distance:   Left Eye Distance:   Bilateral Distance:    Right Eye Near:    Left Eye Near:    Bilateral Near:     Physical Exam Constitutional:      Appearance: Normal appearance.  HENT:     Head: Normocephalic and atraumatic.  Eyes:     Extraocular Movements: Extraocular movements intact.     Pupils: Pupils are equal, round, and reactive to light.  Cardiovascular:     Rate and Rhythm: Normal rate and regular rhythm.  Pulmonary:     Effort: Pulmonary effort is normal.     Breath sounds: Normal breath sounds.  Neurological:     General: No focal deficit present.     Mental Status: She is alert.      UC Treatments / Results  Labs (all labs ordered are listed, but only abnormal results are displayed) Labs Reviewed  HIV ANTIBODY (ROUTINE TESTING W REFLEX)  RPR    EKG   Radiology No results found.  Procedures Procedures (including critical care time)  Medications Ordered in UC Medications - No data to display  Initial Impression / Assessment and Plan / UC Course  I have reviewed the triage vital signs and the nursing notes.  Pertinent labs & imaging results that were available during my care of the patient were reviewed by me and considered in my medical decision making (see chart for details).    Screen HIV and RPR, labs only without exposure. Education provided on safe sex practices. Final Clinical Impressions(s) / UC Diagnoses   Final diagnoses:  Screen for STD (sexually transmitted disease)     Discharge Instructions      Due to the Holiday your labs may not result until 2-3 days. If any of your results are abnormal , our office will notify you directly. Your most recent STD testing which include chlamydia, gonorrhea, BV, trichomonas, candidiasis were all negative.    ED Prescriptions   None    PDMP not reviewed this encounter.   Bing Neighbors, NP 11/22/22 [phone]

## 2022-11-22 NOTE — ED Triage Notes (Signed)
Here for "STI testing Followup". Swab and Urine done recently "and Negative". "I just need blood work". Asymptomatic.

## 2022-11-22 NOTE — Discharge Instructions (Signed)
Due to the Holiday your labs may not result until 2-3 days. If any of your results are abnormal , our office will notify you directly. Your most recent STD testing which include chlamydia, gonorrhea, BV, trichomonas, candidiasis were all negative.

## 2022-11-23 ENCOUNTER — Encounter (HOSPITAL_BASED_OUTPATIENT_CLINIC_OR_DEPARTMENT_OTHER): Payer: Self-pay | Admitting: Emergency Medicine

## 2022-11-23 ENCOUNTER — Other Ambulatory Visit: Payer: Self-pay

## 2022-11-23 ENCOUNTER — Emergency Department (HOSPITAL_BASED_OUTPATIENT_CLINIC_OR_DEPARTMENT_OTHER)
Admission: EM | Admit: 2022-11-23 | Discharge: 2022-11-23 | Disposition: A | Discharge status: Home / Self Care | Payer: Medicaid Other | Attending: Emergency Medicine | Admitting: Emergency Medicine

## 2022-11-23 DIAGNOSIS — Z21 Asymptomatic human immunodeficiency virus [HIV] infection status: Secondary | ICD-10-CM | POA: Insufficient documentation

## 2022-11-23 DIAGNOSIS — F419 Anxiety disorder, unspecified: Secondary | ICD-10-CM | POA: Diagnosis not present

## 2022-11-23 LAB — HIV ANTIBODY (ROUTINE TESTING W REFLEX): HIV Screen 4th Generation wRfx: NONREACTIVE

## 2022-11-23 LAB — RPR: RPR Ser Ql: NONREACTIVE

## 2022-11-23 NOTE — ED Provider Notes (Signed)
[place] [facility] AT [facility] Provider Note   CSN: [id number] Arrival date & time: 11/23/22  0553     History  Chief Complaint  Patient presents with   Anxiety    Cheyenne Medina is a 22 y.o. female.  Patient is a 22 year old female presenting with complaints of anxiety.  She reports having blood drawn yesterday to test for HIV and syphilis.  She has been extremely anxious about the results and presents here requesting same-day testing.  She reports having "hot flashes", and has read that this could be a symptom.  No fevers or chills.  Remainder of her STD panel from previous visits negative.  The history is provided by the patient.       Home Medications Prior to Admission medications   Medication Sig Start Date End Date Taking? Authorizing Provider  azithromycin (ZITHROMAX) 500 MG tablet SMARTSIG:1 Tablet(s) By Mouth 06/23/22   [provider]  Cholecalciferol 1.25 MG (50000 UT) capsule Take 1 capsule by mouth once a week.    [provider]  ferrous sulfate 325 (65 FE) MG EC tablet Take 1 tablet by mouth daily. 11/26/19   [provider]  fluconazole (DIFLUCAN) 150 MG tablet Take 1 tablet (150 mg total) by mouth once a week. 05/31/22   Wallis Bamberg, PA-C  ibuprofen (ADVIL) 800 MG tablet Take by mouth. 01/21/19   [provider]  medroxyPROGESTERone Acetate 150 MG/ML SUSY medroxyprogesterone 150 mg/mL intramuscular syringe  Inject 1 mL every 3 months by intramuscular route.    [provider]  metroNIDAZOLE (FLAGYL) 500 MG tablet Take 1 tablet (500 mg total) by mouth 2 (two) times daily. 11/17/22   Radford Pax, NP  metroNIDAZOLE (FLAGYL) 500 MG tablet Take 1 tablet every 12 hours by oral route for 7 days.    [provider]  ondansetron (ZOFRAN-ODT) 4 MG disintegrating tablet Take 4 mg by mouth every 8 (eight) hours as needed. 06/23/22   [provider]  Prenat-FeAsp-Meth-FA-DHA w/o A (PRENATE  PIXIE) 10-0.6-0.4-200 MG CAPS Prenate Pixie 10 mg iron-1 mg-200 mg capsule TAKE 1 CAPSULE BY MOUTH ONCE DAILY 01/15/19   [provider]  Prenatal Vit-Fe Fumarate-FA (M-NATAL PLUS) 27-1 MG TABS Take 1 tablet by mouth at bedtime. 12/21/19   [provider]      Allergies    Patient has no known allergies.    Review of Systems   Review of Systems  All other systems reviewed and are negative.   Physical Exam Updated Vital Signs BP (!) 132/90 (BP Location: Right Arm)   Pulse 92   Temp 98.2 F (36.8 C) (Oral)   Resp 17   Ht 5' (1.524 m)   Wt 51.3 kg   LMP 10/30/2022 (Exact Date)   SpO2 100%   BMI 22.07 kg/m  Physical Exam Vitals and nursing note reviewed.  Constitutional:      Comments: Patient appears anxious.  Pulmonary:     Effort: Pulmonary effort is normal.  Neurological:     Mental Status: She is alert and oriented to person, place, and time.     ED Results / Procedures / Treatments   Labs (all labs ordered are listed, but only abnormal results are displayed) Labs Reviewed - No data to display  EKG None  Radiology No results found.  Procedures Procedures    Medications Ordered in ED Medications - No data to display  ED Course/ Medical Decision Making/ A&P  Patient informed that her HIV  test was negative and this is significantly alleviated her anxiety.  I do not feel as though any further workup is indicated.  Patient to follow-up with primary doctor.  She was advised to practice safe sex.  Final Clinical Impression(s) / ED Diagnoses Final diagnoses:  None    Rx / DC Orders ED Discharge Orders     None         Geoffery Lyons, MD 11/23/22 [phone]

## 2022-11-23 NOTE — Discharge Instructions (Signed)
Follow-up with a primary doctor.

## 2022-11-23 NOTE — ED Triage Notes (Signed)
Pt reports she is anxious and having hot flashes due to pending STI test results that are pending, pt states she has read hot flashes can be associated with being HIV positive
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
## 2022-12-13 ENCOUNTER — Inpatient Hospital Stay: Payer: Medicaid Other | Admitting: Nurse Practitioner

## 2022-12-13 ENCOUNTER — Encounter: Payer: Self-pay | Admitting: Nurse Practitioner

## 2022-12-13 VITALS — BP 130/86 | HR 73 | Temp 99.0°F | Ht 60.0 in

## 2022-12-13 DIAGNOSIS — R5383 Other fatigue: Secondary | ICD-10-CM

## 2022-12-13 DIAGNOSIS — F32A Depression, unspecified: Secondary | ICD-10-CM

## 2022-12-13 DIAGNOSIS — F419 Anxiety disorder, unspecified: Secondary | ICD-10-CM | POA: Diagnosis not present

## 2022-12-13 DIAGNOSIS — O99019 Anemia complicating pregnancy, unspecified trimester: Secondary | ICD-10-CM

## 2022-12-13 HISTORY — DX: Anemia complicating pregnancy, unspecified trimester: O99.019

## 2022-12-13 HISTORY — DX: Depression, unspecified: F32.A

## 2022-12-13 NOTE — Progress Notes (Signed)
New Patient Office Visit  Subjective:  Patient ID: Cheyenne Medina, female    DOB: 09-11-2000  Age: 22 y.o. MRN: [id number]  CC:  Chief Complaint  Patient presents with   Follow-up    Hospital Follow up, anxiety, depression.     HPI Cheyenne Medina is a 22 y.o. female who presents to establish care.  Has no PCP.  Had a physical done by OB/GYN in January 2024.   Anxiety and depression.  Patient presents with complaints of anxiety and depression on and off for the past 4 months, stated that she has not been able to rest well after having her child 3 years ago, states that her child's father helps as needed.  She is home alone with her daughter.  She denies, HI, SI.  Never been on medication has never seen a therapist.  She has a concern about her vaginal area feels like the skin color is different and has been worried about this.  Examination of the outer vaginal area was normal no rashes no redness or swelling noted.   Patient encouraged to get the HPV vaccine.  Need for the vaccine discussed   History reviewed. No pertinent past medical history.  Past Surgical History:  Procedure Laterality Date   DILATION AND CURETTAGE OF UTERUS      History reviewed. No pertinent family history.  Social History   Socioeconomic History   Marital status: Single    Spouse name: Not on file   Number of children: 1   Years of education: Not on file   Highest education level: Not on file  Occupational History   Not on file  Tobacco Use   Smoking status: Never    Passive exposure: Never   Smokeless tobacco: Never  Vaping Use   Vaping status: Never Used  Substance and Sexual Activity   Alcohol use: Not Currently    Comment: occasionally   Drug use: Never   Sexual activity: Yes    Birth control/protection: None  Other Topics Concern   Not on file  Social History Narrative   Lives home alone with her child   Social Determinants of Health   Financial Resource Strain: Not  on file  Food Insecurity: Not on file  Transportation Needs: Not on file  Physical Activity: Not on file  Stress: Not on file  Social Connections: Not on file  Intimate Partner Violence: Not on file    ROS Review of Systems  Constitutional:  Positive for fatigue. Negative for activity change, appetite change, chills and fever.  HENT:  Negative for congestion, dental problem, ear discharge, ear pain, hearing loss, rhinorrhea, sinus pressure, sinus pain, sneezing and sore throat.   Eyes:  Negative for pain, discharge, redness and itching.  Respiratory:  Negative for cough, chest tightness, shortness of breath and wheezing.   Cardiovascular:  Negative for chest pain, palpitations and leg swelling.  Gastrointestinal:  Negative for abdominal distention, abdominal pain, anal bleeding, blood in stool, constipation, diarrhea, nausea, rectal pain and vomiting.  Endocrine: Negative for cold intolerance, heat intolerance, polydipsia, polyphagia and polyuria.  Genitourinary:  Negative for difficulty urinating, dysuria, flank pain, frequency, hematuria, menstrual problem, pelvic pain and vaginal bleeding.  Musculoskeletal:  Negative for arthralgias, back pain, gait problem, joint swelling and myalgias.  Skin:  Negative for color change, pallor, rash and wound.  Allergic/Immunologic: Negative for environmental allergies, food allergies and immunocompromised state.  Neurological:  Negative for dizziness, tremors, facial asymmetry, weakness and headaches.  Hematological:  Negative for adenopathy. Does not bruise/bleed easily.  Psychiatric/Behavioral:  Negative for agitation, behavioral problems, confusion, decreased concentration, hallucinations, self-injury and suicidal ideas. The patient is nervous/anxious.     Objective:   Today's Vitals: BP 130/86   Pulse 73   Temp 99 F (37.2 C)   Ht 5' (1.524 m)   LMP 11/28/2022 (Approximate)   SpO2 100%   BMI 22.07 kg/m   Physical Exam Vitals and  nursing note reviewed. Exam conducted with a chaperone present.  Constitutional:      General: She is not in acute distress.    Appearance: Normal appearance. She is not ill-appearing, toxic-appearing or diaphoretic.  HENT:     Mouth/Throat:     Mouth: Mucous membranes are moist.     Pharynx: Oropharynx is clear. No oropharyngeal exudate or posterior oropharyngeal erythema.  Eyes:     General: No scleral icterus.       Right eye: No discharge.        Left eye: No discharge.     Extraocular Movements: Extraocular movements intact.     Conjunctiva/sclera: Conjunctivae normal.  Cardiovascular:     Rate and Rhythm: Normal rate and regular rhythm.     Pulses: Normal pulses.     Heart sounds: Normal heart sounds. No murmur heard.    No friction rub. No gallop.  Pulmonary:     Effort: Pulmonary effort is normal. No respiratory distress.     Breath sounds: Normal breath sounds. No stridor. No wheezing, rhonchi or rales.  Chest:     Chest wall: No tenderness.  Abdominal:     General: There is no distension.     Palpations: Abdomen is soft.     Tenderness: There is no abdominal tenderness. There is no right CVA tenderness, left CVA tenderness or guarding.     Hernia: There is no hernia in the left inguinal area or right inguinal area.  Genitourinary:    Exam position: Lithotomy position.     Pubic Area: No rash or pubic lice.      Tanner stage (genital): 5.     Labia:        Right: No rash, tenderness, lesion or injury.        Left: No rash, tenderness, lesion or injury.      Urethra: No prolapse, urethral pain, urethral swelling or urethral lesion.  Musculoskeletal:        General: No swelling, tenderness, deformity or signs of injury.     Right lower leg: No edema.     Left lower leg: No edema.  Lymphadenopathy:     Lower Body: No right inguinal adenopathy. No left inguinal adenopathy.  Skin:    General: Skin is warm and dry.     Capillary Refill: Capillary refill takes less than  2 seconds.     Coloration: Skin is not jaundiced or pale.     Findings: No bruising, erythema or lesion.  Neurological:     Mental Status: She is alert and oriented to person, place, and time.     Motor: No weakness.     Coordination: Coordination normal.     Gait: Gait normal.  Psychiatric:        Mood and Affect: Mood normal.        Behavior: Behavior normal.        Judgment: Judgment normal.     Assessment & Plan:   Problem List Items Addressed This Visit       Other  Anxiety and depression - Primary    Flowsheet Row Office Visit from 12/13/2022 in [facility]  PHQ-9 Total Score 18          12/13/2022    3:42 PM 12/13/2022    3:41 PM  GAD 7 : Generalized Anxiety Score  Nervous, Anxious, on Edge 3 3  Control/stop worrying 3 3  Worry too much - different things 3 3  Trouble relaxing 3 3  Restless 0 0  Easily annoyed or irritable 3 3  Afraid - awful might happen 0 0  Total GAD 7 Score 15 15  Anxiety Difficulty Very difficult    She refused medications Agrees for counseling, referral made .  Encouraged to seek urgent care if needed.  Labs done by OBGYN in january 2024 were normal    Results Created Date Observation Date Name Description Value Unit Range Abnormal Flag LastModifiedBy Organization Detail LastModifiedTime  12/18/2019 12/20/2019 STREP GP B NAA strep gp B NAA Positive   negative abnormal Not Available [facility]) [address], [place], [place], [address], Ph [phone] 12/20/2019 13:37:45  12/18/2019 12/18/2019 CT/NG other info Other Information       Not Available [facility] [place] [address] [place], [place], [address], Ph [phone] 12/20/2019 17:25:29  12/18/2019 12/20/2019 CT/NG vagina,swab         Not Available [facility] [address] [place], [place], [address], Ph [phone] 12/20/2019 17:25:29  12/18/2019 12/20/2019 CT/NG chlamydia result Negative       Not Available [facility] [address] [place], [place], [address], Ph [phone] 12/20/2019 17:25:29  12/18/2019 12/20/2019 CT/NG gonorrhea result Negative       Not Available [facility] [address] [place], [place], [address], Ph [phone] 12/20/2019 17:25:29  01/21/2020 01/21/2020 PAP W REFLEX HR HPV FOR ASCUS other info Other Information       Not Available [facility] [address] [place], [place], [address], Ph [phone] 01/27/2020 05:53:37  01/21/2020 01/27/2020 PAP W REFLEX HR HPV FOR ASCUS cervix,thinprep vial         Not Available [facility] [address] [place], [place], [address], Ph [phone] 01/27/2020 05:53:37  03/09/2021 03/10/2021 TSH+FREE T4 TSH 1.400 uIU/mL 0.450-4.500   Not Available [facility]) [address] [place], [place], [place], [address], Ph [phone] 03/10/2021 05:48:16  03/09/2021 03/10/2021 TSH+FREE T4 T4,free(direct) 1.28 NG/dL 4.62-7.03   Not Available [facility]) [address] [place], [place], [address], Ph [phone] 03/10/2021 05:48:16  03/09/2021 03/10/2021 COMP. METABOLIC PANEL (14) glucose 90 mg/dL 96-78   Not Available [facility]) [address], [place], [place], [address], Ph [phone] 03/10/2021 05:48:16  03/09/2021 03/10/2021 COMP. METABOLIC PANEL (14) BUN 7 mg/dL 7-78   Not Available [facility]) [address], [place], [place], [address], Ph [phone] 03/10/2021 05:48:16  03/09/2021 03/10/2021 COMP. METABOLIC PANEL (14) creatinine 0.52 mg/dL 0.86-7.61 below low normal Not Available [facility]) [address], [place], [place], [address], Ph [phone] 03/10/2021 05:48:16  03/09/2021 03/10/2021 COMP. METABOLIC PANEL (14) eGFR 135 mL/min/1.73 >59   Not Available [facility]) [address], [place], [place], [address], Ph [phone] 03/10/2021 05:48:16  03/09/2021 03/10/2021 COMP. METABOLIC PANEL (14) BUN/creatinine ratio 13    9-23  Not Available [facility]) [address] [place], [place], [address], Ph [phone] 03/10/2021 05:48:16  03/09/2021 03/10/2021 COMP. METABOLIC PANEL (14) sodium 138 mmol/L 134-144   Not Available [facility]) [address], [place], [place], [address], Ph [phone] 03/10/2021 05:48:16  03/09/2021 03/10/2021 COMP. METABOLIC PANEL (14) potassium 3.7 mmol/L 3.5-5.2   Not Available [facility]) [address], [place], [place], [address], Ph [phone] 03/10/2021 05:48:16  03/09/2021 03/10/2021 COMP. METABOLIC PANEL (14) chloride 101 mmol/L 96-106   Not Available [facility]) [address], [place], [place], [address], Ph [phone] 03/10/2021 05:48:16  03/09/2021 03/10/2021 COMP. METABOLIC PANEL (14) carbon dioxide, total 23 mmol/L 20-29   Not Available [facility]) [address], [place], [place], [address], Ph [phone] 03/10/2021 05:48:16  03/09/2021 03/10/2021 COMP. METABOLIC PANEL (14) calcium 9.4 mg/dL 7.0-35.0   Not Available [facility]) [address], [place], [place], [address], Ph [phone] 03/10/2021 05:48:16  03/09/2021 03/10/2021 COMP. METABOLIC PANEL (14) protein, total 6.9 g/dL 7.8-9.3   Not Available [facility]) [address], [place], [place], [address], Ph [phone] 03/10/2021 05:48:16  03/09/2021 03/10/2021 COMP. METABOLIC PANEL (14) albumin 4.5 g/dL 8.2-4.2   Not Available [facility]) [address] [place], [place], [address], Ph [phone] 03/10/2021 05:48:16  03/09/2021 03/10/2021 COMP. METABOLIC PANEL (14) globulin, total 2.4 g/dL 7.6-1.9   Not Available [facility]) [address] [place], [place], [address], Ph [phone] 03/10/2021 05:48:16  03/09/2021 03/10/2021 COMP. METABOLIC PANEL (14) A/G ratio 1.9   1.2-2.2   Not Available [facility]) [address] [place], [place], [address], Ph [phone]  03/10/2021 05:48:16  03/09/2021 03/10/2021 COMP. METABOLIC PANEL (14) bilirubin, total 0.8 mg/dL 1.9-3.7   Not Available [facility]) [address] [place], [place], [address], Ph [phone] 03/10/2021 05:48:16  03/09/2021 03/10/2021 COMP. METABOLIC PANEL (14) alkaline phosphatase 64 IU/L 44-121   Not Available [facility]) [address], [place], [place], [address], Ph [phone] 03/10/2021 05:48:16  03/09/2021 03/10/2021 COMP. METABOLIC PANEL (14) AST (SGOT) 25 IU/L 0-40   Not Available [facility]) [address] [place], [place], [address], Ph [phone] 03/10/2021 05:48:16  03/09/2021 03/10/2021 COMP. METABOLIC PANEL (14) ALT (SGPT) 22 IU/L 0-32   Not Available [facility]) [address] [place], [place], [address], Ph [phone] 03/10/2021 05:48:16  03/09/2021 03/09/2021 CBC, PLATELET, NO DIFFERENTIAL WBC 5.1 x10e3/uL 3.4-10.8   Not Available [facility]) [address] [place], [place], [address], Ph [phone] 03/10/2021 05:48:17  03/09/2021 03/09/2021 CBC, PLATELET, NO DIFFERENTIAL RBC 3.85 x10e6/uL 3.77-5.28   Not Available [facility]) [address], [place], [place], [address], Ph [phone] 03/10/2021 05:48:17  03/09/2021 03/09/2021 CBC, PLATELET, NO DIFFERENTIAL hemoglobin 11.3 g/dL 62.9-47.6   Not Available [facility]) [address] [place], [place], [address], Ph [phone] 03/10/2021 05:48:17  03/09/2021 03/09/2021 CBC, PLATELET, NO DIFFERENTIAL hematocrit 35.2 % 34.0-46.6   Not Available [facility]) [address] [place], [place], [address], Ph [phone] 03/10/2021 05:48:17  03/09/2021 03/09/2021 CBC, PLATELET, NO DIFFERENTIAL MCV 91 fL 79-97   Not Available [facility]) [address], [place], [place], [address], Ph [phone] 03/10/2021 05:48:17  03/09/2021 03/09/2021 CBC, PLATELET, NO DIFFERENTIAL MCH 29.4 pg 26.6-33.0  Not  Available [facility]) [address], [place], [place], [address], Ph [phone] 03/10/2021 05:48:17  03/09/2021 03/09/2021 CBC, PLATELET, NO DIFFERENTIAL MCHC 32.1 g/dL 69.6-29.5   Not Available [facility]) [address], [place], [place], [address], Ph [phone] 03/10/2021 05:48:17  03/09/2021 03/09/2021 CBC, PLATELET, NO DIFFERENTIAL RDW 12.0 % 11.7-15.4   Not Available [facility]) [address] [place], [place], [address], Ph [phone] 03/10/2021 05:48:17  03/09/2021 03/09/2021 CBC, PLATELET, NO DIFFERENTIAL platelets 187 x10e3/uL 150-450   Not Available [facility]) [address], [place], [place], [address], Ph [phone] 03/10/2021 05:48:17  03/09/2021 03/09/2021 CBC, PLATELET, NO DIFFERENTIAL NRBC NP       Not Available [facility]) [address] [place], [place], [place], [address], Ph [phone] 03/10/2021 05:48:17  03/09/2021 03/10/2021 LIPID PANEL WITH LDL/HDL RATIO cholesterol, total 144 mg/dL 355-732   Not Available [facility]) [address], [place], [place], [address], Ph [phone] 03/10/2021 05:48:18  03/09/2021 03/10/2021 LIPID PANEL WITH LDL/HDL RATIO triglycerides 74 mg/dL 3-151   Not Available [facility]) [address], [place], [place], [address], Ph [phone] 03/10/2021 05:48:18  03/09/2021 03/10/2021 LIPID PANEL WITH LDL/HDL RATIO HDL cholesterol 51 mg/dL >85   Not Available [facility]) [address], [place], [place], [address], Ph [phone] 03/10/2021 05:48:18  03/09/2021 03/10/2021 LIPID PANEL WITH LDL/HDL RATIO VLDL cholesterol cal 15 mg/dL 9-93   Not Available [facility]) [address] [place], [place], [address], Ph [phone] 03/10/2021 05:48:18  03/09/2021 03/10/2021 LIPID PANEL WITH LDL/HDL RATIO LDL chol calc (nih) 78 mg/dL 1-02   Not Available [facility]) [address] [place], [place], [address], Ph  [phone] 03/10/2021 05:48:18  03/09/2021 03/10/2021 LIPID PANEL WITH LDL/HDL RATIO comment: NP       Not Available [facility]) [address] [place], [place], [address], Ph [phone] 03/10/2021 05:48:18  03/09/2021 03/10/2021 LIPID PANEL WITH LDL/HDL RATIO LDL/HDL ratio 1.5 ratio 0.0-3.2   Not Available [facility]) [address] [place], [place], [address], Ph [phone] 03/10/2021 05:48:18  03/09/2021 03/09/2021 HGB A1C WITH EAG ESTIMATION hemoglobin A1C 5.6 % 4.8-5.6   Not Available [facility]) [address] [place], [place], [address], Ph [phone] 03/10/2021 05:48:18  03/09/2021 03/09/2021 HGB A1C WITH EAG ESTIMATION estim. avg glu (EAG) 114 mg/dL     Not Available [facility]) [address] [place], [place], [address], Ph [phone] 03/10/2021 05:48:18  03/09/2021 03/10/2021 VITAMIN D, 25-HYDROXY vitamin D, 25-hydroxy 15.6 NG/mL 30.0-100.0 below low normal Not Available [facility]) [address], [place], [place], [address], Ph [phone] 03/10/2021 05:48:19  03/09/2021 03/09/2021 CT/NG + [facility] other info Other Information       Not Available [facility] [place] [facility] [address] [place], [place], [address], Ph [phone] 03/10/2021 15:57:03  03/09/2021 03/10/2021 CT/NG + [facility] urogenital,swab/transport tube         Not Available [facility] [place] [facility] [address] [place], [place], [address], Ph [phone] 03/10/2021 15:57:03  03/09/2021 03/10/2021 CT/NG + [facility] chlamydia result Negative       Not Available [facility] [place] [facility] [address] [place], [place], [address], Ph [phone] 03/10/2021 15:57:03  03/09/2021 03/10/2021 CT/NG + [facility] gonorrhea result Negative  Not Available [facility] [place] [facility] [address] [place], [place], [address], Ph [phone] 03/10/2021 15:57:03  03/09/2021 03/10/2021 CT/NG + [facility] trichomonas result Negative       Not Available [facility] [place] [facility] [address] [place], [place], [address], Ph [phone] 03/10/2021 15:57:03  07/11/2022 07/11/2022 STI PROFILE interpretation Comment       Not Available [facility]) [address] [place], [place], [place], [address], Ph [phone] 07/12/2022 05:53:47  07/11/2022 07/12/2022 STI PROFILE HBsAg screen Negative   negative   Not Available [facility]) [address] [place], [place], [place], [address], Ph [phone] 07/12/2022 05:53:47  07/11/2022 07/12/2022 STI PROFILE hep B surface Ab, qual Reactive       Not Available [facility]) [address] [place], [place], [place], [address], Ph [phone] 07/12/2022 05:53:47  07/11/2022 07/12/2022 STI PROFILE hep B core Ab, tot Negative   negative   Not Available [facility]) [address] [place], [place], [place], [address], Ph [phone] 07/12/2022 05:53:47  07/11/2022 07/12/2022 STI PROFILE rfx to hbc IgM Comment       Not Available [facility]) [address] [place], [place], [place], [address], Ph [phone] 07/12/2022 05:53:47  07/11/2022 07/12/2022 STI PROFILE HCV Ab Non Reactive   non reactive   Not Available [facility]) [address] [place], [place], [place], [address], Ph [phone] 07/12/2022 05:53:47  07/11/2022 07/12/2022 STI PROFILE interpretation: Comment       Not Available [facility]) [address] [place], [place], [place], [address], Ph [phone] 07/12/2022 05:53:47  07/11/2022 07/12/2022 STI PROFILE RPR Non Reactive   non reactive   Not Available [facility]) [address] [place], [place], [place], [address], Ph [phone] 07/12/2022 05:53:47  07/11/2022 07/12/2022 STI PROFILE HIV Ab/P24 Ag screen Non Reactive   non reactive   Not Available [facility]) [address] [place], [place], [place], [address], Ph [phone] 07/12/2022 05:53:47  07/11/2022 07/12/2022 TSH+FREE T4 TSH 1.850 uIU/mL 0.450-4.500   Not Available [facility]) [address] [place], [place], [place], [address], Ph [phone] 07/12/2022 05:53:47  07/11/2022 07/12/2022 TSH+FREE T4 T4,free(direct) 1.19 NG/dL 3.82-5.05   Not Available [facility]) [address] [place], [place], [place], [address], Ph [phone] 07/12/2022 05:53:47  07/11/2022 07/12/2022 COMP. METABOLIC PANEL (14) glucose 90 mg/dL 09-73   Not Available [facility]) [address], [place], [place], [address], Ph [phone] 07/12/2022 05:53:48  07/11/2022 07/12/2022 COMP. METABOLIC PANEL (14) BUN 10 mg/dL 2-22   Not Available [facility]) [address], [place], [place], [address], Ph [phone] 07/12/2022 05:53:48  07/11/2022 07/12/2022 COMP. METABOLIC PANEL (14) creatinine 0.62 mg/dL 1.44-8.18   Not Available [facility]) [address] [place], [place], [address], Ph [phone] 07/12/2022 05:53:48  07/11/2022 07/12/2022 COMP. METABOLIC PANEL (14) eGFR 128 mL/min/1.73 >59   Not Available [facility]) [address] [place], [place], [address], Ph [phone] 07/12/2022 05:53:48  07/11/2022 07/12/2022 COMP. METABOLIC PANEL (14) BUN/creatinine ratio 16   9-23   Not Available [facility]) [address] [place], [place], [address], Ph [phone] 07/12/2022 05:53:48  07/11/2022 07/12/2022 COMP. METABOLIC PANEL (14) sodium 139 mmol/L 134-144   Not Available [facility]) [address] [place], [place], [address], Ph [phone] 07/12/2022 05:53:48  07/11/2022 07/12/2022  COMP. METABOLIC PANEL (14) potassium 4.0 mmol/L 3.5-5.2   Not Available [facility]) [address], [place], [place], [address], Ph [phone] 07/12/2022 05:53:48  07/11/2022 07/12/2022 COMP. METABOLIC PANEL (14) chloride 101 mmol/L 96-106   Not Available [facility]) [address] [place], [place], [address], Ph [phone] 07/12/2022 05:53:48  07/11/2022 07/12/2022 COMP. METABOLIC PANEL (14) carbon  dioxide, total 23 mmol/L 20-29   Not Available [facility]) [address], [place], [place], [address], Ph [phone] 07/12/2022 05:53:48  07/11/2022 07/12/2022 COMP. METABOLIC PANEL (14) calcium 9.6 mg/dL 5.5-73.2   Not Available [facility]) [address] [place], [place], [address], Ph [phone] 07/12/2022 05:53:48  07/11/2022 07/12/2022 COMP. METABOLIC PANEL (14) protein, total 7.6 g/dL 3.1-5.1   Not Available [facility]) [address] [place], [place], [address], Ph [phone] 07/12/2022 05:53:48  07/11/2022 07/12/2022 COMP. METABOLIC PANEL (14) albumin 4.8 g/dL 8.5-4.6   Not Available [facility]) [address], [place], [place], [address], Ph [phone] 07/12/2022 05:53:48  07/11/2022 07/12/2022 COMP. METABOLIC PANEL (14) globulin, total 2.8 g/dL 3.7-1.6   Not Available [facility]) [address] [address] [place], [place], [address], Ph [phone] 07/12/2022 05:53:48  07/11/2022 07/12/2022 COMP. METABOLIC PANEL (14) A/G ratio 1.7   1.2-2.2   Not Available [facility]) [address], [place], [place], [address], Ph [phone] 07/12/2022 05:53:48  07/11/2022 07/12/2022 COMP. METABOLIC PANEL (14) bilirubin, total 1.1 mg/dL 4.4-3.1   Not Available [facility]) [address], [place], [place], [address], Ph [phone] 07/12/2022 05:53:48  07/11/2022 07/12/2022 COMP. METABOLIC PANEL (14) alkaline phosphatase 52 IU/L 44-121   Not Available [facility]) [address] [place], [place], [address], Ph [phone] 07/12/2022 05:53:48  07/11/2022 07/12/2022 COMP. METABOLIC PANEL (14) AST (SGOT) 24 IU/L 0-40   Not Available [facility]) [address], [place], [place], [address], Ph [phone] 07/12/2022 05:53:48  07/11/2022 07/12/2022 COMP. METABOLIC PANEL (14) ALT (SGPT) 17 IU/L 0-32   Not Available [facility]) [address], [place], [place],  [address], Ph [phone] 07/12/2022 05:53:48  07/11/2022 07/11/2022 CBC, PLATELET, NO DIFFERENTIAL WBC 5.7 x10e3/uL 3.4-10.8   Not Available [facility]) [address] [place], [place], [address], Ph [phone] 07/12/2022 05:53:48  07/11/2022 07/11/2022 CBC, PLATELET, NO DIFFERENTIAL RBC 3.85 x10e6/uL 3.77-5.28   Not Available [facility]) [address], [place], [place], [address], Ph [phone] 07/12/2022 05:53:48  07/11/2022 07/11/2022 CBC, PLATELET, NO DIFFERENTIAL hemoglobin 11.7 g/dL 85.6-31.4   Not Available [facility]) [address], [place], [place], [address], Ph [phone] 07/12/2022 05:53:48  07/11/2022 07/11/2022 CBC, PLATELET, NO DIFFERENTIAL hematocrit 35.0 % 34.0-46.6   Not Available [facility]) [address], [place], [place], [address], Ph [phone] 07/12/2022 05:53:48  07/11/2022 07/11/2022 CBC, PLATELET, NO DIFFERENTIAL MCV 91 fL 79-97   Not Available [facility]) [address] [place], [place], [address], Ph [phone] 07/12/2022 05:53:48  07/11/2022 07/11/2022 CBC, PLATELET, NO DIFFERENTIAL MCH 30.4 pg 26.6-33.0   Not Available [facility]) [address], [place], [place], [address], Ph [phone] 07/12/2022 05:53:48  07/11/2022 07/11/2022 CBC, PLATELET, NO DIFFERENTIAL MCHC 33.4 g/dL 17.0-01.7   Not Available [facility]) [address], [place], [place], [address], Ph [phone] 07/12/2022 05:53:48  07/11/2022  07/11/2022 CBC, PLATELET, NO DIFFERENTIAL RDW 12.1 % 11.7-15.4   Not Available [facility]) [address], [place], [place], [address], Ph [phone] 07/12/2022 05:53:48  07/11/2022 07/11/2022 CBC, PLATELET, NO DIFFERENTIAL platelets 215 x10e3/uL 150-450   Not Available [facility]) [address] [place], [place], [address], Ph [phone] 07/12/2022 05:53:48  07/11/2022 07/11/2022 CBC, PLATELET, NO DIFFERENTIAL NRBC NP        Not Available [facility]) [address], [place], [place], [address], Ph [phone] 07/12/2022 05:53:48  07/11/2022 07/12/2022 LIPID PANEL WITH LDL/HDL RATIO cholesterol, total 166 mg/dL 244-010   Not Available [facility]) [address] [place], [place], [address], Ph [phone] 07/12/2022 05:53:49  07/11/2022 07/12/2022 LIPID PANEL WITH LDL/HDL RATIO triglycerides 53 mg/dL 5-956   Not Available [facility]) [address] [place], [place], [address], Ph [phone] 07/12/2022 05:53:49  07/11/2022 07/12/2022 LIPID PANEL WITH LDL/HDL RATIO HDL cholesterol 69 mg/dL >16   Not Available [facility]) [address], [place], [place], [address], Ph [phone] 07/12/2022 05:53:49  07/11/2022 07/12/2022 LIPID PANEL WITH LDL/HDL RATIO VLDL cholesterol cal 11 mg/dL 5-73   Not Available [facility]) [address] [place], [place], [address], Ph [phone] 07/12/2022 05:53:49  07/11/2022 07/12/2022 LIPID PANEL WITH LDL/HDL RATIO LDL chol calc (nih) 86 mg/dL 8-31   Not Available [facility]) [address] [place], [place], [address], Ph [phone] 07/12/2022 05:53:49  07/11/2022 07/12/2022 LIPID PANEL WITH LDL/HDL RATIO comment: NP       Not Available [facility]) [address] [place], [place], [address], Ph [phone] 07/12/2022 05:53:49  07/11/2022 07/12/2022 LIPID PANEL WITH LDL/HDL RATIO LDL/HDL ratio 1.2 ratio 0.0-3.2   Not Available [facility]) [address], [place], [place], [address], Ph [phone] 07/12/2022 05:53:49  07/11/2022 07/11/2022 HGB A1C WITH EAG ESTIMATION hemoglobin A1C 5.4 % 4.8-5.6   Not Available [facility]) [address], [place], [place], [address], Ph [phone] 07/12/2022 05:53:50  07/11/2022 07/11/2022 HGB A1C WITH EAG ESTIMATION estim. avg glu (EAG) 108 mg/dL     Not Available [facility]) [address],  [place], [place], [address], Ph [phone] 07/12/2022 05:53:50  07/11/2022 07/12/2022 VITAMIN D, 25-HYDROXY vitamin D, 25-hydroxy 28.3 NG/mL 30.0-100.0 below low normal Not Available [facility]) [address], [place], [place], [address], Ph [phone] 07/12/2022 05:53:50  07/11/2022 07/11/2022 CT/NG + [facility] other info Other Information       Not Available [facility] [address] [place], [place], [address], Ph [phone] 07/12/2022 13:29:53  07/11/2022 07/12/2022 CT/NG + [facility] urogenital,swab/transport tube         Not Available [facility] [address] [place], [place], [address], Ph [phone] 07/12/2022 13:29:53  07/11/2022 07/12/2022 CT/NG + [facility] chlamydia result Negative       Not Available [facility] [place] [address] [place], [place], [address], Ph [phone] 07/12/2022 13:29:53  07/11/2022 07/12/2022 CT/NG + [facility] gonorrhea result Negative       Not Available [facility] [place] [facility] [address] [place], [place], [address], Ph [phone] 07/12/2022 13:29:53  07/11/2022 07/12/2022 CT/NG + [facility] trichomonas result Negative              Fatigue     Fatigue most likely due  to her anxiety and depression Patient encouraged to drink at least 64 ounces of water daily to maintain hydration need for regular moderate exercises at least 150 minutes weekly discussed. Most recent labs drawn by OB/GYN about 6 months ago where normal no anemia thyroid function was normal    Results Created Date Observation Date Name Description Value Unit Range Abnormal Flag LastModifiedBy Organization Detail LastModifiedTime  12/18/2019 12/20/2019 STREP GP B NAA strep gp B NAA Positive   negative abnormal Not Available [facility]) [address] [place], [place], [address], Ph [phone] 12/20/2019 13:37:45  12/18/2019 12/18/2019 CT/NG other info Other Information       Not Available [facility] [place] [facility] [address] [place], [place],  [address], Ph [phone] 12/20/2019 17:25:29  12/18/2019 12/20/2019 CT/NG vagina,swab         Not Available [facility] [place] [facility] [address] [place], [place], [address], Ph [phone] 12/20/2019 17:25:29  12/18/2019 12/20/2019 CT/NG chlamydia result Negative       Not Available [facility] [place] [facility] [address] [place], [place], [address], Ph [phone] 12/20/2019 17:25:29  12/18/2019 12/20/2019 CT/NG gonorrhea result Negative       Not Available [facility] [place] [facility] [address] [place], [place], [address], Ph [phone] 12/20/2019 17:25:29  01/21/2020 01/21/2020 PAP W REFLEX HR HPV FOR ASCUS other info Other Information       Not Available [facility] [place] [facility] [address] [place], [place], [address], Ph [phone] 01/27/2020 05:53:37  01/21/2020 01/27/2020 PAP W REFLEX HR HPV FOR ASCUS cervix,thinprep vial         Not Available [facility] [address] [place], [place], [address], Ph [phone] 01/27/2020 05:53:37  03/09/2021 03/10/2021 TSH+FREE T4 TSH 1.400 uIU/mL 0.450-4.500   Not Available [facility]) [address] [place], [place], [place], [address], Ph [phone] 03/10/2021 05:48:16  03/09/2021 03/10/2021 TSH+FREE T4 T4,free(direct) 1.28 NG/dL 8.31-5.17   Not Available [facility]) [address] [place], [place], [place], [address], Ph [phone] 03/10/2021 05:48:16  03/09/2021 03/10/2021 COMP. METABOLIC PANEL (14) glucose 90 mg/dL 54-62   Not Available [facility]) [address] [place], [place], [address], Ph [phone] 03/10/2021 05:48:16  03/09/2021 03/10/2021 COMP. METABOLIC PANEL (14) BUN 7 mg/dL 7-16   Not Available [facility]) [address], [place], [place], [address], Ph [phone] 03/10/2021 05:48:16  03/09/2021 03/10/2021 COMP. METABOLIC PANEL (14) creatinine 0.52 mg/dL 5.85-2.77 below low normal Not Available [facility]) [address], [place], [place], [address], Ph [phone] 03/10/2021 05:48:16  03/09/2021 03/10/2021 COMP. METABOLIC PANEL (14) eGFR 135 mL/min/1.73 >59   Not Available [facility]) [address] [place], [place], [address], Ph [phone] 03/10/2021 05:48:16  03/09/2021 03/10/2021 COMP. METABOLIC PANEL (14) BUN/creatinine ratio 13   9-23   Not Available [facility]) [address], [place], [place], [address], Ph [phone] 03/10/2021 05:48:16  03/09/2021 03/10/2021 COMP. METABOLIC PANEL (14) sodium 138 mmol/L 134-144   Not Available [facility]) [address] [place], [place], [address], Ph [phone] 03/10/2021 05:48:16  03/09/2021 03/10/2021 COMP. METABOLIC PANEL (14) potassium 3.7 mmol/L 3.5-5.2   Not Available [facility]) [address], [place], [place], [address], Ph [phone] 03/10/2021 05:48:16  03/09/2021 03/10/2021 COMP. METABOLIC PANEL (14) chloride 101 mmol/L 96-106  Not Available [facility]) [address] [place], [place], [address], Ph [phone] 03/10/2021 05:48:16  03/09/2021 03/10/2021 COMP. METABOLIC PANEL (14) carbon dioxide, total 23 mmol/L 20-29   Not Available [facility]) [address], [place], [place], [address], Ph [phone] 03/10/2021 05:48:16  03/09/2021 03/10/2021 COMP. METABOLIC PANEL (14) calcium 9.4 mg/dL 5.7-84.6   Not Available [facility]) [address], [place], [place], [address], Ph [phone] 03/10/2021 05:48:16  03/09/2021 03/10/2021 COMP. METABOLIC PANEL (14) protein, total 6.9 g/dL 0.2-7.2   Not Available [facility]) [address] [place], [place], [address], Ph [phone] 03/10/2021 05:48:16  03/09/2021 03/10/2021 COMP. METABOLIC PANEL (14) albumin 4.5 g/dL 6.3-8.7   Not Available [facility]) [address], [place], [place], [address], Ph [phone] 03/10/2021 05:48:16  03/09/2021 03/10/2021 COMP. METABOLIC PANEL (14) globulin, total 2.4 g/dL 0.6-3.0    Not Available [facility]) [address], [place], [place], [address], Ph [phone] 03/10/2021 05:48:16  03/09/2021 03/10/2021 COMP. METABOLIC PANEL (14) A/G ratio 1.9   1.2-2.2   Not Available [facility]) [address], [place], [place], [address], Ph [phone] 03/10/2021 05:48:16  03/09/2021 03/10/2021 COMP. METABOLIC PANEL (14) bilirubin, total 0.8 mg/dL 1.5-1.7   Not Available [facility]) [address] [place], [place], [address], Ph [phone] 03/10/2021 05:48:16  03/09/2021 03/10/2021 COMP. METABOLIC PANEL (14) alkaline phosphatase 64 IU/L 44-121   Not Available [facility]) [address] [place], [place], [address], Ph [phone] 03/10/2021 05:48:16  03/09/2021 03/10/2021 COMP. METABOLIC PANEL (14) AST (SGOT) 25 IU/L 0-40   Not Available [facility]) [address] [place], [place], [address], Ph [phone] 03/10/2021 05:48:16  03/09/2021 03/10/2021 COMP. METABOLIC PANEL (14) ALT (SGPT) 22 IU/L 0-32   Not Available [facility]) [address] [place], [place], [address], Ph [phone] 03/10/2021 05:48:16  03/09/2021 03/09/2021 CBC, PLATELET, NO DIFFERENTIAL WBC 5.1 x10e3/uL 3.4-10.8   Not Available [facility]) [address] [place], [place], [address], Ph [phone] 03/10/2021 05:48:17  03/09/2021 03/09/2021 CBC, PLATELET, NO DIFFERENTIAL RBC 3.85 x10e6/uL 3.77-5.28   Not Available [facility]) [address] [place], [place], [address], Ph [phone] 03/10/2021 05:48:17  03/09/2021 03/09/2021 CBC, PLATELET, NO DIFFERENTIAL hemoglobin 11.3 g/dL 45.8-09.9   Not Available [facility]) [address], [place], [place], [address], Ph [phone] 03/10/2021 05:48:17  03/09/2021 03/09/2021 CBC, PLATELET, NO DIFFERENTIAL hematocrit 35.2 % 34.0-46.6   Not Available [facility]) [address] [place], [place], [address], Ph [phone]  03/10/2021 05:48:17  03/09/2021 03/09/2021 CBC, PLATELET, NO DIFFERENTIAL MCV 91 fL 79-97   Not Available [facility]) [address] [place], [place], [address], Ph [phone] 03/10/2021 05:48:17  03/09/2021 03/09/2021 CBC, PLATELET, NO DIFFERENTIAL MCH 29.4 pg 26.6-33.0   Not Available [facility]) [address], [place], [place], [address], Ph [phone] 03/10/2021 05:48:17  03/09/2021 03/09/2021 CBC, PLATELET, NO DIFFERENTIAL MCHC 32.1 g/dL 14.4-81.8   Not Available [facility]) [address] [place], [place], [address], Ph [phone] 03/10/2021 05:48:17  03/09/2021 03/09/2021 CBC, PLATELET, NO DIFFERENTIAL RDW 12.0 % 11.7-15.4   Not Available [facility]) [address], [place], [place], [address], Ph [phone] 03/10/2021 05:48:17  03/09/2021 03/09/2021 CBC, PLATELET, NO DIFFERENTIAL platelets 187 x10e3/uL 150-450  Not Available [facility]) [address], [place], [place], [address], Ph [phone] 03/10/2021 05:48:17  03/09/2021 03/09/2021 CBC, PLATELET, NO DIFFERENTIAL NRBC NP       Not Available [facility]) [address], [place], [place], [address], Ph [phone] 03/10/2021 05:48:17  03/09/2021 03/10/2021 LIPID PANEL WITH LDL/HDL RATIO cholesterol, total 144 mg/dL 578-469   Not Available [facility]) [address], [place], [place], [address], Ph [phone] 03/10/2021 05:48:18  03/09/2021 03/10/2021 LIPID PANEL WITH LDL/HDL RATIO triglycerides 74 mg/dL 2-725   Not Available [facility]) [address], [place], [place], [address], Ph [phone] 03/10/2021 05:48:18  03/09/2021 03/10/2021 LIPID PANEL WITH LDL/HDL RATIO HDL cholesterol 51 mg/dL >38   Not Available [facility]) [address] [place], [place], [address], Ph [phone] 03/10/2021 05:48:18  03/09/2021 03/10/2021 LIPID PANEL WITH LDL/HDL RATIO VLDL cholesterol cal 15 mg/dL 6-06   Not  Available [facility]) [address], [place], [place], [address], Ph [phone] 03/10/2021 05:48:18  03/09/2021 03/10/2021 LIPID PANEL WITH LDL/HDL RATIO LDL chol calc (nih) 78 mg/dL 2-20   Not Available [facility]) [address], [place], [place], [address], Ph [phone] 03/10/2021 05:48:18  03/09/2021 03/10/2021 LIPID PANEL WITH LDL/HDL RATIO comment: NP       Not Available [facility]) [address], [place], [place], [address], Ph [phone] 03/10/2021 05:48:18  03/09/2021 03/10/2021 LIPID PANEL WITH LDL/HDL RATIO LDL/HDL ratio 1.5 ratio 0.0-3.2   Not Available [facility]) [address], [place], [place], [address], Ph [phone] 03/10/2021 05:48:18  03/09/2021 03/09/2021 HGB A1C WITH EAG ESTIMATION hemoglobin A1C 5.6 % 4.8-5.6   Not Available [facility]) [address] [place], [place], [address], Ph [phone] 03/10/2021 05:48:18  03/09/2021 03/09/2021 HGB A1C WITH EAG ESTIMATION estim. avg glu (EAG) 114 mg/dL     Not Available [facility]) [address] [place], [place], [address], Ph [phone] 03/10/2021 05:48:18  03/09/2021 03/10/2021 VITAMIN D, 25-HYDROXY vitamin D, 25-hydroxy 15.6 NG/mL 30.0-100.0 below low normal Not Available [facility]) [address] [place], [place], [address], Ph [phone] 03/10/2021 05:48:19  03/09/2021 03/09/2021 CT/NG + [facility] other info Other Information       Not Available [facility] [place] [facility] [address] [place], [place], [address], Ph [phone] 03/10/2021 15:57:03  03/09/2021 03/10/2021 CT/NG + [facility] urogenital,swab/transport tube         Not Available [facility] [facility] [address] [place], [place], [address], Ph [phone] 03/10/2021 15:57:03  03/09/2021 03/10/2021 CT/NG + [facility] chlamydia result Negative       Not Available [facility] [place] [facility] [address] [place], [place], [address], Ph [phone] 03/10/2021  15:57:03  03/09/2021 03/10/2021 CT/NG + [facility] gonorrhea result Negative       Not Available [facility] [place] [facility] [address] [place], [place], [address], Ph [phone] 03/10/2021 15:57:03  03/09/2021 03/10/2021 CT/NG + [facility] trichomonas result Negative       Not Available [facility] [place] [facility] [address] [place], [place], [address], Ph [phone] 03/10/2021 15:57:03  07/11/2022 07/11/2022 STI PROFILE interpretation Comment       Not Available [facility]) [address] [place], [place], [place], [address], Ph [phone] 07/12/2022 05:53:47  07/11/2022 07/12/2022 STI PROFILE HBsAg screen Negative  negative   Not Available [facility]) [address], [place], [place], [address], Ph [phone] 07/12/2022 05:53:47  07/11/2022 07/12/2022 STI PROFILE hep B surface Ab, qual Reactive       Not Available [facility]) [address] [place], [place], [place], [address], Ph [phone] 07/12/2022 05:53:47  07/11/2022 07/12/2022 STI PROFILE hep B core Ab, tot Negative   negative   Not Available [facility]) [address], [place], [place], [address], Ph [phone] 07/12/2022 05:53:47  07/11/2022 07/12/2022 STI PROFILE rfx to hbc IgM Comment       Not Available [facility]) [address] [place], [place], [place], [address], Ph [phone] 07/12/2022 05:53:47  07/11/2022 07/12/2022 STI PROFILE HCV Ab Non Reactive   non reactive   Not Available [facility]) [address] [place], [place], [place], [address], Ph [phone] 07/12/2022 05:53:47  07/11/2022 07/12/2022 STI PROFILE interpretation: Comment       Not Available [facility]) [address] [place], [place], [place], [address], Ph [phone] 07/12/2022 05:53:47  07/11/2022 07/12/2022 STI PROFILE RPR Non Reactive   non reactive   Not Available [facility]) [address] [place], [place], [place], [address], Ph [phone] 07/12/2022 05:53:47  07/11/2022 07/12/2022 STI  PROFILE HIV Ab/P24 Ag screen Non Reactive   non reactive   Not Available [facility]) [address] [place], [place], [place], [address], Ph [phone] 07/12/2022 05:53:47  07/11/2022 07/12/2022 TSH+FREE T4 TSH 1.850 uIU/mL 0.450-4.500   Not Available [facility]) [address] [place], [place], [place], [address], Ph [phone] 07/12/2022 05:53:47  07/11/2022 07/12/2022 TSH+FREE T4 T4,free(direct) 1.19 NG/dL 3.26-7.12   Not Available [facility]) [address], [place], [place], [address], Ph [phone] 07/12/2022 05:53:47  07/11/2022 07/12/2022 COMP. METABOLIC PANEL (14) glucose 90 mg/dL 97-67   Not Available [facility]) [address] [place], [place], [address], Ph [phone] 07/12/2022 05:53:48  07/11/2022 07/12/2022 COMP. METABOLIC PANEL (14) BUN 10 mg/dL 3-29   Not Available [facility]) [address] [place], [place], [address], Ph [phone] 07/12/2022 05:53:48  07/11/2022 07/12/2022 COMP. METABOLIC PANEL (14) creatinine 0.62 mg/dL 7.98-9.21   Not Available [facility]) [address] [place], [place], [address], Ph [phone] 07/12/2022 05:53:48  07/11/2022 07/12/2022 COMP. METABOLIC PANEL (14) eGFR 128 mL/min/1.73 >59   Not Available [facility]) [address] [place], [place], [address], Ph [phone] 07/12/2022 05:53:48  07/11/2022 07/12/2022 COMP. METABOLIC PANEL (14) BUN/creatinine ratio 16   9-23   Not Available [facility]) [address], [place], [place], [address], Ph [phone] 07/12/2022 05:53:48  07/11/2022 07/12/2022 COMP. METABOLIC PANEL (14) sodium 139 mmol/L 134-144   Not Available [facility]) [address], [place], [place], [address], Ph [phone] 07/12/2022 05:53:48  07/11/2022 07/12/2022 COMP. METABOLIC PANEL (14) potassium 4.0 mmol/L 3.5-5.2   Not Available [facility]) [address], [place], [place], [address], Ph [phone] 07/12/2022 05:53:48  07/11/2022 07/12/2022 COMP. METABOLIC PANEL (14) chloride 101 mmol/L 96-106   Not Available [facility]) [address] [place], [place], [address], Ph [phone] 07/12/2022 05:53:48  07/11/2022 07/12/2022 COMP. METABOLIC PANEL (14) carbon dioxide, total 23 mmol/L 20-29   Not Available [facility]) [address] [place], [place], [address], Ph [phone] 07/12/2022 05:53:48  07/11/2022  07/12/2022 COMP. METABOLIC PANEL (14) calcium 9.6 mg/dL 4.0-98.1   Not Available [facility]) [address] [place], [place], [address], Ph [phone] 07/12/2022 05:53:48  07/11/2022 07/12/2022 COMP. METABOLIC PANEL (14) protein, total 7.6 g/dL 6.5-7.8   Not Available [facility]) [address] [place], [place], [address], Ph [phone] 07/12/2022 05:53:48  07/11/2022 07/12/2022 COMP. METABOLIC PANEL (14) albumin 4.8 g/dL 0.1-0.2   Not Available [facility]) [address] [place], [place], [address], Ph [phone] 07/12/2022 05:53:48  07/11/2022 07/12/2022 COMP. METABOLIC PANEL (14) globulin, total 2.8 g/dL 9.5-6.3   Not Available [facility]) [address] [place], [place], [address], Ph [phone] 07/12/2022 05:53:48  07/11/2022 07/12/2022 COMP. METABOLIC PANEL (14) A/G ratio 1.7   1.2-2.2   Not Available [facility]) [address] [place], [place], [address], Ph [phone] 07/12/2022 05:53:48  07/11/2022 07/12/2022 COMP. METABOLIC PANEL (14) bilirubin, total 1.1 mg/dL 5.5-7.3   Not Available [facility]) [address] [place], [place], [address], Ph [phone] 07/12/2022 05:53:48  07/11/2022 07/12/2022 COMP. METABOLIC PANEL (14) alkaline phosphatase 52 IU/L 44-121   Not Available [facility]) [address] [place], [place], [address], Ph [phone] 07/12/2022 05:53:48  07/11/2022 07/12/2022 COMP. METABOLIC PANEL (14) AST (SGOT) 24  IU/L 0-40   Not Available [facility]) [address] [place], [place], [address], Ph [phone] 07/12/2022 05:53:48  07/11/2022 07/12/2022 COMP. METABOLIC PANEL (14) ALT (SGPT) 17 IU/L 0-32   Not Available [facility]) [address] [place], [place], [address], Ph [phone] 07/12/2022 05:53:48  07/11/2022 07/11/2022 CBC, PLATELET, NO DIFFERENTIAL WBC 5.7 x10e3/uL 3.4-10.8   Not Available [facility]) [address], [place], [place], [address], Ph [phone] 07/12/2022 05:53:48  07/11/2022 07/11/2022 CBC, PLATELET, NO DIFFERENTIAL RBC 3.85 x10e6/uL 3.77-5.28   Not Available [facility]) [address], [place], [place], [address], Ph [phone] 07/12/2022 05:53:48  07/11/2022 07/11/2022 CBC, PLATELET, NO DIFFERENTIAL hemoglobin 11.7 g/dL 31.5-40.0   Not Available [facility]) [address], [place], [place], [address], Ph [phone] 07/12/2022 05:53:48  07/11/2022 07/11/2022 CBC, PLATELET, NO DIFFERENTIAL hematocrit 35.0 % 34.0-46.6   Not Available [facility]) [address] [place], [place], [address], Ph [phone] 07/12/2022 05:53:48  07/11/2022 07/11/2022 CBC, PLATELET, NO DIFFERENTIAL MCV 91 fL 79-97   Not Available [facility]) [address] [place], [place], [address], Ph [phone] 07/12/2022 05:53:48  07/11/2022 07/11/2022 CBC, PLATELET, NO DIFFERENTIAL MCH 30.4 pg 26.6-33.0   Not Available [facility]) [address] [place], [place], [address], Ph [phone] 07/12/2022 05:53:48  07/11/2022 07/11/2022 CBC, PLATELET, NO DIFFERENTIAL MCHC 33.4 g/dL 19.6-22.2   Not Available [facility]) [address] [place], [place], [address], Ph [phone] 07/12/2022 05:53:48  07/11/2022 07/11/2022 CBC, PLATELET, NO DIFFERENTIAL RDW 12.1 % 11.7-15.4   Not Available [facility]) [address] [place], [place], [address], Ph [phone]  07/12/2022 05:53:48  07/11/2022 07/11/2022 CBC, PLATELET, NO DIFFERENTIAL platelets 215 x10e3/uL 150-450   Not Available [facility]) [address] [place], [place], [address], Ph [phone] 07/12/2022 05:53:48  07/11/2022 07/11/2022 CBC, PLATELET, NO DIFFERENTIAL NRBC NP       Not Available [facility]) [address], [place], [place], [address], Ph [phone] 07/12/2022 05:53:48  07/11/2022 07/12/2022 LIPID PANEL WITH LDL/HDL RATIO cholesterol, total 166 mg/dL 161-096   Not Available [facility]) [address], [place], [place], [address], Ph [phone] 07/12/2022 05:53:49  07/11/2022 07/12/2022 LIPID PANEL WITH LDL/HDL RATIO triglycerides 53 mg/dL 9-562   Not Available [facility]) [address] [place], [place], [address], Ph [phone] 07/12/2022 05:53:49  07/11/2022 07/12/2022 LIPID PANEL WITH LDL/HDL RATIO HDL cholesterol 69 mg/dL >28   Not Available [facility]) [address] [place], [place], [address], Ph [phone] 07/12/2022 05:53:49  07/11/2022 07/12/2022 LIPID PANEL WITH LDL/HDL RATIO VLDL cholesterol cal 11 mg/dL 6-44   Not Available [facility]) [address] [place], [place], [address], Ph [phone] 07/12/2022 05:53:49  07/11/2022 07/12/2022 LIPID PANEL WITH LDL/HDL RATIO LDL chol calc (nih) 86 mg/dL 4-33   Not Available [facility]) [address] [place], [place], [address], Ph [phone] 07/12/2022 05:53:49  07/11/2022 07/12/2022 LIPID PANEL WITH LDL/HDL RATIO comment: NP       Not Available [facility]) [address] [place], [place], [address], Ph [phone] 07/12/2022 05:53:49  07/11/2022 07/12/2022 LIPID PANEL WITH LDL/HDL RATIO LDL/HDL ratio 1.2 ratio 0.0-3.2   Not Available [facility]) [address], [place], [place], [address], Ph [phone] 07/12/2022 05:53:49  07/11/2022 07/11/2022 HGB A1C WITH EAG ESTIMATION hemoglobin  A1C 5.4 % 4.8-5.6   Not Available [facility]) [address], [place], [place], [address], Ph [phone] 07/12/2022 05:53:50  07/11/2022 07/11/2022 HGB A1C WITH EAG ESTIMATION estim. avg glu (EAG) 108 mg/dL     Not Available [facility]) [address] [place], [place], [address], Ph [phone] 07/12/2022 05:53:50  07/11/2022 07/12/2022 VITAMIN D, 25-HYDROXY vitamin D, 25-hydroxy 28.3 NG/mL 30.0-100.0 below low normal Not Available [facility]) [address] [place], [place], [address], Ph [phone] 07/12/2022 05:53:50  07/11/2022 07/11/2022 CT/NG + [facility] other info Other Information       Not Available [facility] [address] [place], [place], [address], Ph [phone] 07/12/2022 13:29:53  07/11/2022 07/12/2022 CT/NG + [facility] urogenital,swab/transport tube         Not Available [facility] [address] [place], [place], [address], Ph [phone] 07/12/2022 13:29:53  07/11/2022 07/12/2022 CT/NG + [facility] chlamydia result Negative       Not Available [facility] [address] [place], [place], [address], Ph [phone] 07/12/2022 13:29:53  07/11/2022 07/12/2022 CT/NG + [facility] gonorrhea result Negative       Not Available [facility] [address] [place], [place], [address], Ph [phone] 07/12/2022 13:29:53  07/11/2022 07/12/2022 CT/NG + [facility] trichomonas result Negative               Outpatient Encounter Medications as of 12/13/2022  Medication Sig   [DISCONTINUED] azithromycin (ZITHROMAX) 500 MG tablet SMARTSIG:1 Tablet(s) By Mouth (Patient not taking: Reported on 12/13/2022)   [DISCONTINUED] Cholecalciferol 1.25 MG (50000 UT) capsule Take 1 capsule by mouth once a week. (Patient not taking: Reported on 12/13/2022)   [DISCONTINUED] ferrous sulfate 325 (65 FE) MG EC tablet Take 1 tablet by mouth daily. (Patient not taking: Reported on 12/13/2022)   [DISCONTINUED] fluconazole (DIFLUCAN) 150 MG  tablet Take 1 tablet (150 mg total) by mouth once a week. (Patient not taking: Reported on 12/13/2022)   [DISCONTINUED] ibuprofen (  ADVIL) 800 MG tablet Take by mouth. (Patient not taking: Reported on 12/13/2022)   [DISCONTINUED] medroxyPROGESTERone Acetate 150 MG/ML SUSY medroxyprogesterone 150 mg/mL intramuscular syringe  Inject 1 mL every 3 months by intramuscular route. (Patient not taking: Reported on 12/13/2022)   [DISCONTINUED] metroNIDAZOLE (FLAGYL) 500 MG tablet Take 1 tablet (500 mg total) by mouth 2 (two) times daily. (Patient not taking: Reported on 12/13/2022)   [DISCONTINUED] metroNIDAZOLE (FLAGYL) 500 MG tablet Take 1 tablet every 12 hours by oral route for 7 days. (Patient not taking: Reported on 12/13/2022)   [DISCONTINUED] ondansetron (ZOFRAN-ODT) 4 MG disintegrating tablet Take 4 mg by mouth every 8 (eight) hours as needed. (Patient not taking: Reported on 12/13/2022)   [DISCONTINUED] Prenat-FeAsp-Meth-FA-DHA w/o A (PRENATE PIXIE) 10-0.6-0.4-200 MG CAPS Prenate Pixie 10 mg iron-1 mg-200 mg capsule TAKE 1 CAPSULE BY MOUTH ONCE DAILY (Patient not taking: Reported on 12/13/2022)   [DISCONTINUED] Prenatal Vit-Fe Fumarate-FA (M-NATAL PLUS) 27-1 MG TABS Take 1 tablet by mouth at bedtime. (Patient not taking: Reported on 12/13/2022)   No facility-administered encounter medications on file as of 12/13/2022.    Follow-up: Return in about 7 months (around 07/16/2023) for CPE, F/U WITH SUSAN FOR COUNSELING.   Donell Beers, FNP

## 2022-12-13 NOTE — Patient Instructions (Signed)

## 2022-12-13 NOTE — Assessment & Plan Note (Signed)
Fatigue most likely due to her anxiety and depression Patient encouraged to drink at least 64 ounces of water daily to maintain hydration need for regular moderate exercises at least 150 minutes weekly discussed. Most recent labs drawn by OB/GYN about 6 months ago where normal no anemia thyroid function was normal    Results Created Date Observation Date Name Description Value Unit Range Abnormal Flag LastModifiedBy Organization Detail LastModifiedTime  12/18/2019 12/20/2019 STREP GP B NAA strep gp B NAA Positive   negative abnormal Not Available [facility]) [address] [place], [place], [address], Ph [phone] 12/20/2019 13:37:45  12/18/2019 12/18/2019 CT/NG other info Other Information       Not Available [facility] [address] [place], [place], [address], Ph [phone] 12/20/2019 17:25:29  12/18/2019 12/20/2019 CT/NG vagina,swab         Not Available [facility] [address] [place], [place], [address], Ph [phone] 12/20/2019 17:25:29  12/18/2019 12/20/2019 CT/NG chlamydia result Negative       Not Available [facility] [place] [facility] [address] [place], [place], [address], Ph [phone] 12/20/2019 17:25:29  12/18/2019 12/20/2019 CT/NG gonorrhea result Negative       Not Available [facility] [address] [place], [place], [address], Ph [phone] 12/20/2019 17:25:29  01/21/2020 01/21/2020 PAP W REFLEX HR HPV FOR ASCUS other info Other Information       Not Available [facility] [place] [place] [facility] [address] [place], [place], [address], Ph [phone] 01/27/2020 05:53:37  01/21/2020 01/27/2020 PAP W REFLEX HR HPV FOR ASCUS cervix,thinprep vial         Not Available [facility] [address] [place], [place], [address], Ph [phone] 01/27/2020 05:53:37  03/09/2021 03/10/2021 TSH+FREE T4 TSH 1.400 uIU/mL 0.450-4.500   Not Available [facility]) [address] [place], [place], [place], [address], Ph [phone] 03/10/2021 05:48:16  03/09/2021 03/10/2021 TSH+FREE T4 T4,free(direct) 1.28 NG/dL 8.31-5.17   Not Available [facility]) [address] [place], [place], [place], [address], Ph [phone] 03/10/2021 05:48:16  03/09/2021 03/10/2021 COMP. METABOLIC PANEL (14) glucose 90 mg/dL 54-62   Not Available [facility]) [address] [place], [place], [address], Ph [phone] 03/10/2021 05:48:16  03/09/2021 03/10/2021 COMP. METABOLIC PANEL (14) BUN 7 mg/dL 7-16   Not Available [facility]) [address], [place], [place], [address], Ph [phone] 03/10/2021 05:48:16  03/09/2021 03/10/2021 COMP. METABOLIC PANEL (14) creatinine 0.52 mg/dL 5.85-2.77 below low normal Not Available [facility]) [address] [place], [place], [address], Ph [phone] 03/10/2021 05:48:16  03/09/2021 03/10/2021 COMP. METABOLIC PANEL (14) eGFR 135 mL/min/1.73 >59   Not Available [facility]) [address] [place], [place], [address], Ph [phone] 03/10/2021 05:48:16  03/09/2021 03/10/2021 COMP. METABOLIC PANEL (14) BUN/creatinine ratio 13   9-23   Not Available [facility]) [address], [place], [place], [address], Ph [phone] 03/10/2021 05:48:16  03/09/2021 03/10/2021 COMP. METABOLIC PANEL (14) sodium 138 mmol/L 134-144   Not Available [facility]) [address], [place], [place], [address], Ph [phone] 03/10/2021 05:48:16  03/09/2021 03/10/2021 COMP. METABOLIC PANEL (14) potassium 3.7 mmol/L 3.5-5.2   Not Available [facility]) [address], [place], [place], [address], Ph [phone] 03/10/2021 05:48:16  03/09/2021 03/10/2021 COMP. METABOLIC PANEL (14) chloride 101  mmol/L 96-106   Not Available [facility]) [address] [place], [place], [address], Ph [phone] 03/10/2021 05:48:16  03/09/2021 03/10/2021 COMP. METABOLIC PANEL (14) carbon dioxide, total 23 mmol/L 20-29   Not  Available [facility]) [address] [place], [place], [address], Ph [phone] 03/10/2021 05:48:16  03/09/2021 03/10/2021 COMP. METABOLIC PANEL (14) calcium 9.4 mg/dL 5.7-84.6   Not Available [facility]) [address], [place], [place], [address], Ph [phone] 03/10/2021 05:48:16  03/09/2021 03/10/2021 COMP. METABOLIC PANEL (14) protein, total 6.9 g/dL 0.2-7.2   Not Available [facility]) [address] [place], [place], [address], Ph [phone] 03/10/2021 05:48:16  03/09/2021 03/10/2021 COMP. METABOLIC PANEL (14) albumin 4.5 g/dL 6.3-8.7   Not Available [facility]) [address] [place], [place], [address], Ph [phone] 03/10/2021 05:48:16  03/09/2021 03/10/2021 COMP. METABOLIC PANEL (14) globulin, total 2.4 g/dL 0.6-3.0   Not Available [facility]) [address], [place], [place], [address], Ph [phone] 03/10/2021 05:48:16  03/09/2021 03/10/2021 COMP. METABOLIC PANEL (14) A/G ratio 1.9   1.2-2.2   Not Available [facility]) [address] [place], [place], [address], Ph [phone] 03/10/2021 05:48:16  03/09/2021 03/10/2021 COMP. METABOLIC PANEL (14) bilirubin, total 0.8 mg/dL 1.5-1.7   Not Available [facility]) [address], [place], [place], [address], Ph [phone] 03/10/2021 05:48:16  03/09/2021 03/10/2021 COMP. METABOLIC PANEL (14) alkaline phosphatase 64 IU/L 44-121   Not Available [facility]) [address], [place], [place], [address], Ph [phone] 03/10/2021 05:48:16  03/09/2021 03/10/2021 COMP. METABOLIC PANEL (14) AST (SGOT) 25 IU/L 0-40   Not Available [facility]) [address] [place], [place], [address], Ph [phone] 03/10/2021 05:48:16  03/09/2021 03/10/2021 COMP. METABOLIC PANEL (14) ALT (SGPT) 22 IU/L 0-32   Not Available [facility]) [address] [place], [place], [address], Ph [phone] 03/10/2021  05:48:16  03/09/2021 03/09/2021 CBC, PLATELET, NO DIFFERENTIAL WBC 5.1 x10e3/uL 3.4-10.8   Not Available [facility]) [address], [place], [place], [address], Ph [phone] 03/10/2021 05:48:17  03/09/2021 03/09/2021 CBC, PLATELET, NO DIFFERENTIAL RBC 3.85 x10e6/uL 3.77-5.28   Not Available [facility]) [address], [place], [place], [address], Ph [phone] 03/10/2021 05:48:17  03/09/2021 03/09/2021 CBC, PLATELET, NO DIFFERENTIAL hemoglobin 11.3 g/dL 45.8-09.9   Not Available [facility]) [address] [place], [place], [address], Ph [phone] 03/10/2021 05:48:17  03/09/2021 03/09/2021 CBC, PLATELET, NO DIFFERENTIAL hematocrit 35.2 % 34.0-46.6   Not Available [facility]) [address] [place], [place], [address], Ph [phone] 03/10/2021 05:48:17  03/09/2021 03/09/2021 CBC, PLATELET, NO DIFFERENTIAL MCV 91 fL 79-97   Not Available [facility]) [address], [place], [place], [address], Ph [phone] 03/10/2021 05:48:17  03/09/2021 03/09/2021 CBC, PLATELET, NO DIFFERENTIAL MCH 29.4 pg 26.6-33.0   Not Available [facility]) [address] [place], [place], [address], Ph [phone] 03/10/2021 05:48:17  03/09/2021 03/09/2021 CBC, PLATELET, NO DIFFERENTIAL MCHC 32.1 g/dL 14.4-81.8   Not Available [facility]) [address], [place], [place], [address], Ph [phone] 03/10/2021 05:48:17  03/09/2021 03/09/2021 CBC, PLATELET, NO DIFFERENTIAL RDW 12.0 % 11.7-15.4   Not Available [facility]) [address], [place], [place], [address], Ph [phone] 03/10/2021 05:48:17  03/09/2021 03/09/2021 CBC, PLATELET, NO DIFFERENTIAL platelets  187 x10e3/uL 150-450   Not Available [facility]) [address], [place], [place], [address], Ph [phone] 03/10/2021 05:48:17  03/09/2021 03/09/2021 CBC, PLATELET, NO DIFFERENTIAL NRBC NP       Not Available [facility]) [address], [place], [place], [address], Ph [phone] 03/10/2021 05:48:17  03/09/2021 03/10/2021 LIPID PANEL WITH LDL/HDL RATIO cholesterol, total 144 mg/dL 169-678   Not Available [facility]) [address] [place], [place], [address], Ph [phone] 03/10/2021 05:48:18  03/09/2021 03/10/2021 LIPID PANEL WITH LDL/HDL RATIO triglycerides 74 mg/dL 7-782   Not Available [facility]) [address], [place], [place], [address], Ph [phone] 03/10/2021 05:48:18  03/09/2021 03/10/2021 LIPID PANEL WITH LDL/HDL RATIO HDL cholesterol 51 mg/dL >86   Not Available [facility]) [address] [place], [place], [address], Ph [phone] 03/10/2021 05:48:18  03/09/2021 03/10/2021 LIPID PANEL WITH LDL/HDL RATIO VLDL cholesterol cal 15 mg/dL 8-09   Not Available [facility]) [address], [place], [place], [address], Ph [phone] 03/10/2021 05:48:18  03/09/2021 03/10/2021 LIPID PANEL WITH LDL/HDL RATIO LDL chol calc (nih) 78 mg/dL 4-19   Not Available [facility]) [address], [place], [place], [address], Ph [phone] 03/10/2021 05:48:18  03/09/2021 03/10/2021 LIPID PANEL WITH LDL/HDL RATIO comment: NP       Not Available [facility]) [address] [place], [place], [address], Ph [phone] 03/10/2021 05:48:18  03/09/2021 03/10/2021 LIPID PANEL WITH LDL/HDL RATIO LDL/HDL ratio 1.5 ratio 0.0-3.2   Not Available [facility]) [address], [place], [place], [address], Ph [phone] 03/10/2021 05:48:18  03/09/2021 03/09/2021 HGB A1C WITH EAG ESTIMATION hemoglobin A1C 5.6 % 4.8-5.6   Not Available [facility]) [address], [place], [place], [address], Ph [phone] 03/10/2021 05:48:18  03/09/2021 03/09/2021 HGB A1C WITH EAG ESTIMATION estim. avg glu (EAG) 114 mg/dL     Not Available [facility]) [address] [place], [place], [address], Ph [phone]  03/10/2021 05:48:18  03/09/2021 03/10/2021 VITAMIN D, 25-HYDROXY vitamin D, 25-hydroxy 15.6 NG/mL 30.0-100.0 below low normal Not Available [facility]) [address] [place], [place], [address], Ph [phone] 03/10/2021 05:48:19  03/09/2021 03/09/2021 CT/NG + [facility] other info Other Information       Not Available [facility] [place] [facility] [address] [place], [place], [address], Ph [phone] 03/10/2021 15:57:03  03/09/2021 03/10/2021 CT/NG + [facility] urogenital,swab/transport tube         Not Available [facility] [place] [facility] [address] [place], [place], [address], Ph [phone] 03/10/2021 15:57:03  03/09/2021 03/10/2021 CT/NG + [facility] chlamydia result Negative       Not Available [facility] [place] [facility] [address] [place], [place], [address], Ph [phone] 03/10/2021 15:57:03  03/09/2021 03/10/2021 CT/NG + [facility] gonorrhea result Negative       Not Available [facility] [place] [facility] [address] [place], [place], [address], Ph [phone] 03/10/2021 15:57:03  03/09/2021 03/10/2021 CT/NG + [facility] trichomonas result Negative       Not Available [facility] [place] [facility] [address] [place], [place], [address], Ph [phone] 03/10/2021 15:57:03  07/11/2022 07/11/2022 STI PROFILE interpretation Comment       Not Available [facility]) [address] [place], [place], [place], [address], Ph [phone] 07/12/2022 05:53:47  07/11/2022 07/12/2022  STI PROFILE HBsAg screen Negative   negative   Not Available [facility]) [address] [place], [place], [address], Ph [phone] 07/12/2022 05:53:47  07/11/2022 07/12/2022 STI PROFILE hep B surface Ab, qual Reactive       Not Available [facility]) [address] [place], [place], [place], [address], Ph [phone] 07/12/2022 05:53:47  07/11/2022 07/12/2022 STI PROFILE hep B core Ab, tot Negative   negative   Not Available [facility]) [address] [place], [place], [address], Ph [phone] 07/12/2022 05:53:47  07/11/2022 07/12/2022 STI PROFILE rfx to hbc IgM Comment       Not Available [facility]) [address] [place], [place], [place], [address], Ph [phone] 07/12/2022 05:53:47  07/11/2022 07/12/2022 STI PROFILE HCV Ab Non Reactive   non reactive   Not Available [facility]) [address] [place], [place], [place], [address], Ph [phone] 07/12/2022 05:53:47  07/11/2022 07/12/2022 STI PROFILE interpretation: Comment       Not Available [facility]) [address] [place], [place], [place], [address], Ph [phone] 07/12/2022 05:53:47  07/11/2022 07/12/2022 STI PROFILE RPR Non Reactive   non reactive   Not Available [facility]) [address] [place], [place], [place], [address], Ph [phone] 07/12/2022 05:53:47  07/11/2022 07/12/2022 STI PROFILE HIV Ab/P24 Ag screen Non Reactive   non reactive   Not Available [facility]) [address] [place], [place], [place], [address], Ph [phone] 07/12/2022 05:53:47  07/11/2022 07/12/2022 TSH+FREE T4 TSH 1.850 uIU/mL 0.450-4.500   Not Available [facility]) [address] [place], [place], [place], [address], Ph [phone] 07/12/2022 05:53:47  07/11/2022 07/12/2022 TSH+FREE T4 T4,free(direct) 1.19 NG/dL 2.99-3.71   Not Available [facility]) [address] [place], [place], [address], Ph [phone] 07/12/2022 05:53:47  07/11/2022 07/12/2022 COMP. METABOLIC PANEL (14) glucose 90 mg/dL 25-85   Not Available [facility]) [address] [place], [place], [address], Ph [phone] 07/12/2022 05:53:48  07/11/2022 07/12/2022 COMP. METABOLIC PANEL (14) BUN 10 mg/dL 1-54   Not Available [facility]) [address] [place], [place], [address], Ph [phone] 07/12/2022 05:53:48  07/11/2022 07/12/2022 COMP. METABOLIC PANEL (14) creatinine 0.62 mg/dL 1.24-5.80   Not Available [facility]) [address], [place], [place], [address], Ph  [phone] 07/12/2022 05:53:48  07/11/2022 07/12/2022 COMP. METABOLIC PANEL (14) eGFR 128 mL/min/1.73 >59   Not Available [facility]) [address] [place], [place], [address], Ph [phone] 07/12/2022 05:53:48  07/11/2022 07/12/2022 COMP. METABOLIC PANEL (14) BUN/creatinine ratio 16   9-23   Not Available [facility]) [address], [place], [place], [address], Ph [phone] 07/12/2022 05:53:48  07/11/2022 07/12/2022 COMP. METABOLIC PANEL (14) sodium 139 mmol/L 134-144   Not Available [facility]) [address], [place], [place], [address], Ph [phone] 07/12/2022 05:53:48  07/11/2022 07/12/2022 COMP. METABOLIC PANEL (14) potassium 4.0 mmol/L 3.5-5.2   Not Available [facility]) [address], [place], [place], [address], Ph [phone] 07/12/2022 05:53:48  07/11/2022 07/12/2022 COMP. METABOLIC PANEL (14) chloride 101 mmol/L 96-106   Not Available [facility] [place] [facility]) [address] [place], [place], [address], Ph [phone] 07/12/2022 05:53:48  07/11/2022 07/12/2022 COMP. METABOLIC PANEL (14) carbon dioxide, total 23 mmol/L 20-29   Not Available [facility]) [address] [place], [place], [place], [address],  Ph [phone] 07/12/2022 05:53:48  07/11/2022 07/12/2022 COMP. METABOLIC PANEL (14) calcium 9.6 mg/dL 8.2-95.6   Not Available [facility]) [address], [place], [place], [address], Ph [phone] 07/12/2022 05:53:48  07/11/2022 07/12/2022 COMP. METABOLIC PANEL (14) protein, total 7.6 g/dL 5.2-8.4   Not Available [facility]) [address], [place], [place], [address], Ph [phone] 07/12/2022 05:53:48  07/11/2022 07/12/2022 COMP. METABOLIC PANEL (14) albumin 4.8 g/dL 4.4-0.3   Not Available [facility]) [address] [place], [place], [address], Ph [phone] 07/12/2022 05:53:48  07/11/2022 07/12/2022 COMP. METABOLIC PANEL (14) globulin, total 2.8 g/dL  3.2-9.5   Not Available [facility]) [address] [place], [place], [address], Ph [phone] 07/12/2022 05:53:48  07/11/2022 07/12/2022 COMP. METABOLIC PANEL (14) A/G ratio 1.7   1.2-2.2   Not Available [facility]) [address], [place], [place], [address], Ph [phone] 07/12/2022 05:53:48  07/11/2022 07/12/2022 COMP. METABOLIC PANEL (14) bilirubin, total 1.1 mg/dL 2.7-0.6   Not Available [facility]) [address] [place], [place], [address], Ph [phone] 07/12/2022 05:53:48  07/11/2022 07/12/2022 COMP. METABOLIC PANEL (14) alkaline phosphatase 52 IU/L 44-121   Not Available [facility]) [address] [place], [place], [address], Ph [phone] 07/12/2022 05:53:48  07/11/2022 07/12/2022 COMP. METABOLIC PANEL (14) AST (SGOT) 24 IU/L 0-40   Not Available [facility]) [address] [place], [place], [address], Ph [phone] 07/12/2022 05:53:48  07/11/2022 07/12/2022 COMP. METABOLIC PANEL (14) ALT (SGPT) 17 IU/L 0-32   Not Available [facility]) [address] [place], [place], [address], Ph [phone] 07/12/2022 05:53:48  07/11/2022 07/11/2022 CBC, PLATELET, NO DIFFERENTIAL WBC 5.7 x10e3/uL 3.4-10.8   Not Available [facility]) [address] [place], [place], [address], Ph [phone] 07/12/2022 05:53:48  07/11/2022 07/11/2022 CBC, PLATELET, NO DIFFERENTIAL RBC 3.85 x10e6/uL 3.77-5.28   Not Available [facility]) [address], [place], [place], [address], Ph [phone] 07/12/2022 05:53:48  07/11/2022 07/11/2022 CBC, PLATELET, NO DIFFERENTIAL hemoglobin 11.7 g/dL 19.5-09.3   Not Available [facility]) [address] [place], [place], [address], Ph [phone] 07/12/2022 05:53:48  07/11/2022 07/11/2022 CBC, PLATELET, NO DIFFERENTIAL hematocrit 35.0 % 34.0-46.6   Not Available [facility]) [address], [place], [place], [address], Ph [phone] 07/12/2022 05:53:48  07/11/2022 07/11/2022 CBC, PLATELET, NO DIFFERENTIAL MCV 91 fL 79-97   Not Available [facility]) [address] [place], [place], [address], Ph [phone] 07/12/2022 05:53:48  07/11/2022 07/11/2022 CBC, PLATELET, NO DIFFERENTIAL MCH 30.4 pg 26.6-33.0   Not Available [facility]) [address], [place], [place], [address], Ph [phone] 07/12/2022 05:53:48  07/11/2022 07/11/2022 CBC, PLATELET, NO DIFFERENTIAL MCHC 33.4 g/dL 92.1-19.4   Not Available [facility]) [address], [place], [place], [address], Ph [phone] 07/12/2022 05:53:48  07/11/2022 07/11/2022 CBC, PLATELET, NO DIFFERENTIAL RDW 12.1 % 11.7-15.4   Not Available [facility]) [address], [place], [place], [address], Ph [phone] 07/12/2022 05:53:48  07/11/2022 07/11/2022 CBC, PLATELET, NO DIFFERENTIAL platelets 215 x10e3/uL 150-450   Not Available [facility]) [address] [place], [place], [address], Ph [phone] 07/12/2022 05:53:48  07/11/2022 07/11/2022 CBC, PLATELET, NO DIFFERENTIAL NRBC NP       Not Available [facility]) [address] [place], [place], [place],  [address], Ph [phone] 07/12/2022 05:53:48  07/11/2022 07/12/2022 LIPID PANEL WITH LDL/HDL RATIO cholesterol, total 166 mg/dL 366-440   Not Available [facility]) [address], [place], [place], [address], Ph [phone] 07/12/2022 05:53:49  07/11/2022 07/12/2022 LIPID PANEL WITH LDL/HDL RATIO triglycerides 53 mg/dL 3-329   Not Available [facility]) [address], [place], [place], [address], Ph [phone] 07/12/2022 05:53:49  07/11/2022 07/12/2022 LIPID PANEL WITH LDL/HDL RATIO HDL cholesterol 69 mg/dL >10   Not Available [facility]) [address] [place], [place], [address], Ph [phone] 07/12/2022 05:53:49  07/11/2022 07/12/2022 LIPID PANEL WITH LDL/HDL RATIO VLDL cholesterol cal 11 mg/dL  7-06   Not Available [facility]) [address] [place], [place], [address], Ph [phone] 07/12/2022 05:53:49  07/11/2022 07/12/2022 LIPID PANEL WITH LDL/HDL RATIO LDL chol calc (nih) 86 mg/dL 7-37   Not Available [facility]) [address] [place], [place], [address], Ph [phone] 07/12/2022 05:53:49  07/11/2022 07/12/2022 LIPID PANEL WITH LDL/HDL RATIO comment: NP       Not Available [facility]) [address] [place], [place], [address], Ph [phone] 07/12/2022 05:53:49  07/11/2022 07/12/2022 LIPID PANEL WITH LDL/HDL RATIO LDL/HDL ratio 1.2 ratio 0.0-3.2   Not Available [facility]) [address] [place], [place], [address], Ph [phone] 07/12/2022 05:53:49  07/11/2022 07/11/2022 HGB A1C WITH EAG ESTIMATION hemoglobin A1C 5.4 % 4.8-5.6   Not Available [facility]) [address], [place], [place], [address], Ph [phone] 07/12/2022 05:53:50  07/11/2022 07/11/2022 HGB A1C WITH EAG ESTIMATION estim. avg glu (EAG) 108 mg/dL     Not Available [facility]) [address], [place], [place], [address], Ph [phone] 07/12/2022 05:53:50  07/11/2022 07/12/2022 VITAMIN D, 25-HYDROXY vitamin D, 25-hydroxy 28.3 NG/mL 30.0-100.0 below low normal Not Available [facility]) [address], [place], [place], [address], Ph [phone] 07/12/2022 05:53:50  07/11/2022 07/11/2022 CT/NG + [facility] other info Other Information       Not Available [facility] [address] [place], [place], [address], Ph [phone] 07/12/2022 13:29:53  07/11/2022 07/12/2022 CT/NG + [facility] urogenital,swab/transport tube         Not Available [facility] [address] [place], [place], [address], Ph [phone] 07/12/2022 13:29:53  07/11/2022 07/12/2022 CT/NG + [facility] chlamydia result Negative       Not Available [facility] [address] [place], [place], [address], Ph [phone]  07/12/2022 13:29:53  07/11/2022 07/12/2022 CT/NG + [facility] gonorrhea result Negative       Not Available [facility] [address] Felipa [place], [place], [address], Ph [phone] 07/12/2022 13:29:53  07/11/2022 07/12/2022 CT/NG + TRICH trichomonas result Negative

## 2022-12-13 NOTE — Assessment & Plan Note (Addendum)
Flowsheet Row Office Visit from 12/13/2022 in [facility]  PHQ-9 Total Score 18          12/13/2022    3:42 PM 12/13/2022    3:41 PM  GAD 7 : Generalized Anxiety Score  Nervous, Anxious, on Edge 3 3  Control/stop worrying 3 3  Worry too much - different things 3 3  Trouble relaxing 3 3  Restless 0 0  Easily annoyed or irritable 3 3  Afraid - awful might happen 0 0  Total GAD 7 Score 15 15  Anxiety Difficulty Very difficult    She refused medications Agrees for counseling, referral made .  Encouraged to seek urgent care if needed.  Labs done by OBGYN in january 2024 were normal    Results Created Date Observation Date Name Description Value Unit Range Abnormal Flag LastModifiedBy Organization Detail LastModifiedTime  12/18/2019 12/20/2019 STREP GP B NAA strep gp B NAA Positive   negative abnormal Not Available [facility]) [address] [place], [place], [address], Ph [phone] 12/20/2019 13:37:45  12/18/2019 12/18/2019 CT/NG other info Other Information       Not Available [facility] [address] [place] Em[place], [place], [address], Ph [phone] 12/20/2019 17:25:29  12/18/2019 12/20/2019 CT/NG vagina,swab         Not Available [facility] [address] [place], [place], [address], Ph [phone] 12/20/2019 17:25:29  12/18/2019 12/20/2019 CT/NG chlamydia result Negative       Not Available [facility] [address] [place] Emory [place], [place], [address], Ph [phone] 12/20/2019 17:25:29  12/18/2019 12/20/2019 CT/NG gonorrhea result Negative       Not Available [facility] [place] [address] [place] Em[place], [place], [address], Ph [phone] 12/20/2019 17:25:29  01/21/2020 01/21/2020 PAP W REFLEX HR HPV FOR ASCUS other info Other Information       Not Available [facility] [address] [place] Emory [place], [place], [address], Ph [phone] 01/27/2020 05:53:37  01/21/2020 01/27/2020 PAP W REFLEX HR HPV FOR ASCUS  cervix,thinprep vial         Not Available [facility] [address] [place], [place], [address], Ph [phone] 01/27/2020 05:53:37  03/09/2021 03/10/2021 TSH+FREE T4 TSH 1.400 uIU/mL 0.450-4.500   Not Available [facility]) [address] [place], [place], [place], [address], Ph [phone] 03/10/2021 05:48:16  03/09/2021 03/10/2021 TSH+FREE T4 T4,free(direct) 1.28 NG/dL 8.31-5.17   Not Available [facility]) [address], [place], [place], [address], Ph [phone] 03/10/2021 05:48:16  03/09/2021 03/10/2021 COMP. METABOLIC PANEL (14) glucose 90 mg/dL 54-62   Not Available [facility]) [address] [place], [place], [address], Ph [phone] 03/10/2021 05:48:16  03/09/2021 03/10/2021 COMP. METABOLIC PANEL (14) BUN 7 mg/dL 7-16   Not Available [facility]) [address] [place], [place], [address], Ph [phone] 03/10/2021 05:48:16  03/09/2021 03/10/2021 COMP. METABOLIC PANEL (14) creatinine 0.52 mg/dL 5.85-2.77 below low normal Not Available [facility]) [address] [place], [place], [address], Ph [phone] 03/10/2021 05:48:16  03/09/2021 03/10/2021 COMP. METABOLIC PANEL (14) eGFR 135 mL/min/1.73 >59   Not Available [facility]) [address], [place], [place], [address], Ph [phone] 03/10/2021 05:48:16  03/09/2021 03/10/2021 COMP. METABOLIC PANEL (14) BUN/creatinine ratio 13   9-23   Not Available [facility]) [facility]  [address], [place], [place], [address], Ph [phone] 03/10/2021 05:48:16  03/09/2021 03/10/2021 COMP. METABOLIC PANEL (14) sodium 138 mmol/L 134-144   Not Available [facility]) [address], [place], [place], [address], Ph [phone] 03/10/2021 05:48:16  03/09/2021 03/10/2021 COMP. METABOLIC PANEL (14) potassium 3.7 mmol/L 3.5-5.2   Not Available [facility]) [address], [place], [place], [address], Ph [phone] 03/10/2021  05:48:16  03/09/2021 03/10/2021 COMP. METABOLIC PANEL (14) chloride 101 mmol/L 96-106   Not Available [facility]) [address], [place], [place], [address], Ph [phone] 03/10/2021 05:48:16  03/09/2021 03/10/2021 COMP. METABOLIC PANEL (14) carbon dioxide, total 23 mmol/L 20-29   Not Available [facility]) [address] [place], [place], [address], Ph [phone] 03/10/2021 05:48:16  03/09/2021 03/10/2021 COMP. METABOLIC PANEL (14) calcium 9.4 mg/dL 1.5-17.6   Not Available [facility]) [address], [place], [place], [address], Ph [phone] 03/10/2021 05:48:16  03/09/2021 03/10/2021 COMP. METABOLIC PANEL (14) protein, total 6.9 g/dL 4.6-2.7   Not Available [facility]) [address], [place], [place], [address], Ph [phone] 03/10/2021 05:48:16  03/09/2021 03/10/2021 COMP. METABOLIC PANEL (14) albumin 4.5 g/dL 1.6-9.6   Not Available [facility]) [address] [place], [place], [address], Ph [phone] 03/10/2021 05:48:16  03/09/2021 03/10/2021 COMP. METABOLIC PANEL (14) globulin, total 2.4 g/dL 5.2-7.7   Not Available [facility]) [address], [place], [place], [address], Ph [phone] 03/10/2021 05:48:16  03/09/2021 03/10/2021 COMP. METABOLIC PANEL (14) A/G ratio 1.9   1.2-2.2   Not Available [facility]) [address] [place], [place], [address], Ph [phone] 03/10/2021 05:48:16  03/09/2021 03/10/2021 COMP. METABOLIC PANEL (14) bilirubin, total 0.8 mg/dL 1.2-4.5   Not Available [facility]) [address], [place], [place], [address], Ph [phone] 03/10/2021 05:48:16  03/09/2021 03/10/2021 COMP. METABOLIC PANEL (14) alkaline phosphatase 64 IU/L 44-121   Not Available [facility]) [address] [place], [place], [address], Ph [phone] 03/10/2021 05:48:16  03/09/2021 03/10/2021 COMP. METABOLIC PANEL (14) AST (SGOT) 25 IU/L 0-40   Not  Available [facility]) [address], [place], [place], [address], Ph [phone] 03/10/2021 05:48:16  03/09/2021 03/10/2021 COMP. METABOLIC PANEL (14) ALT (SGPT) 22 IU/L 0-32   Not Available [facility]) [address] [place], [place], [address], Ph [phone] 03/10/2021 05:48:16  03/09/2021 03/09/2021 CBC, PLATELET, NO DIFFERENTIAL WBC 5.1 x10e3/uL 3.4-10.8   Not Available [facility]) [address] [place], [place], [address], Ph [phone] 03/10/2021 05:48:17  03/09/2021 03/09/2021 CBC, PLATELET, NO DIFFERENTIAL RBC 3.85 x10e6/uL 3.77-5.28   Not Available [facility]) [address], [place], [place], [address], Ph [phone] 03/10/2021 05:48:17  03/09/2021 03/09/2021 CBC, PLATELET, NO DIFFERENTIAL hemoglobin 11.3 g/dL 74.1-28.7   Not Available [facility]) [address] [place], [place], [address], Ph [phone] 03/10/2021 05:48:17  03/09/2021 03/09/2021 CBC, PLATELET, NO DIFFERENTIAL hematocrit 35.2 % 34.0-46.6   Not Available [facility]) [address], [place], [place], [address], Ph [phone] 03/10/2021 05:48:17  03/09/2021 03/09/2021 CBC, PLATELET, NO DIFFERENTIAL MCV 91 fL 79-97   Not Available [facility]) [address], [place], [place], [address], Ph [phone] 03/10/2021 05:48:17  03/09/2021 03/09/2021 CBC, PLATELET, NO DIFFERENTIAL MCH 29.4 pg 26.6-33.0   Not Available [facility]) [address] [place], [place], [address], Ph [phone] 03/10/2021 05:48:17  03/09/2021 03/09/2021 CBC, PLATELET, NO DIFFERENTIAL MCHC 32.1 g/dL 41.3-24.4   Not Available [facility]) [address] [place], [place], [address], Ph [phone] 03/10/2021 05:48:17  03/09/2021 03/09/2021 CBC, PLATELET, NO DIFFERENTIAL RDW 12.0 % 11.7-15.4   Not Available [facility]) [address], [place], [place], [address], Ph [phone] 03/10/2021 05:48:17   03/09/2021 03/09/2021 CBC, PLATELET, NO DIFFERENTIAL platelets 187 x10e3/uL 150-450   Not Available [facility]) [address] [place], [place], [address], Ph [phone] 03/10/2021 05:48:17  03/09/2021 03/09/2021 CBC, PLATELET, NO DIFFERENTIAL NRBC NP       Not Available [facility]) [address] [place], [place], [place], [address], Ph [phone] 03/10/2021 05:48:17  03/09/2021 03/10/2021 LIPID PANEL WITH LDL/HDL RATIO cholesterol, total 144 mg/dL 254-270   Not Available [facility]) [address] [place], [place], [address], Ph [phone] 03/10/2021 05:48:18  03/09/2021 03/10/2021 LIPID PANEL WITH LDL/HDL RATIO triglycerides 74 mg/dL 0-737   Not Available [facility]) [address], [place], [place], [address], Ph [phone] 03/10/2021 05:48:18  03/09/2021 03/10/2021 LIPID PANEL WITH LDL/HDL RATIO HDL cholesterol 51 mg/dL >50   Not Available [facility]) [address] [place], [place], [address], Ph [phone] 03/10/2021 05:48:18  03/09/2021 03/10/2021 LIPID PANEL WITH LDL/HDL RATIO VLDL cholesterol cal 15 mg/dL 7-89   Not Available [facility]) [address] [place], [place], [address], Ph [phone] 03/10/2021 05:48:18  03/09/2021 03/10/2021 LIPID PANEL WITH LDL/HDL RATIO LDL chol calc (nih) 78 mg/dL 7-82   Not Available [facility]) [address], [place], [place], [address], Ph [phone] 03/10/2021 05:48:18  03/09/2021 03/10/2021 LIPID PANEL WITH LDL/HDL RATIO comment: NP       Not Available [facility]) [address], [place], [place], [address], Ph [phone] 03/10/2021 05:48:18  03/09/2021 03/10/2021 LIPID PANEL WITH LDL/HDL RATIO LDL/HDL ratio 1.5 ratio 0.0-3.2   Not Available [facility]) [address], [place], [place], [address], Ph [phone] 03/10/2021 05:48:18  03/09/2021 03/09/2021 HGB A1C WITH EAG ESTIMATION hemoglobin A1C 5.6 % 4.8-5.6    Not Available [facility]) [address], [place], [place], [address], Ph [phone] 03/10/2021 05:48:18  03/09/2021 03/09/2021 HGB A1C WITH EAG ESTIMATION estim. avg glu (EAG) 114 mg/dL     Not Available [facility]) [address], [place], [place], [address], Ph [phone] 03/10/2021 05:48:18  03/09/2021 03/10/2021 VITAMIN D, 25-HYDROXY vitamin D, 25-hydroxy 15.6 NG/mL 30.0-100.0 below low normal Not Available [facility]) [address], [place], [place], [address], Ph [phone] 03/10/2021 05:48:19  03/09/2021 03/09/2021 CT/NG + [facility] other info Other Information       Not Available [facility] [address] [place], [place], [address], Ph [phone] 03/10/2021 15:57:03  03/09/2021 03/10/2021 CT/NG + [facility] urogenital,swab/transport tube         Not Available [facility] [place] [facility] [address] [place], [place], [address], Ph [phone] 03/10/2021 15:57:03  03/09/2021 03/10/2021 CT/NG + [facility] chlamydia result Negative       Not Available [facility] [address] [place], [place], [address], Ph [phone] 03/10/2021 15:57:03  03/09/2021 03/10/2021 CT/NG + TRICH gonorrhea result Negative       Not Available [facility] [place] [facility] [address] [place], [place], [address], Ph [phone] 03/10/2021 15:57:03  03/09/2021 03/10/2021 CT/NG + [facility] trichomonas result Negative       Not Available [facility] [facility] [address] [place], [place], [address], Ph [phone] 03/10/2021 15:57:03  07/11/2022 07/11/2022 STI PROFILE interpretation Comment       Not Available [facility]) [address] [place], [place], [place], [address], Ph [phone] 07/12/2022 05:53:47  07/11/2022 07/12/2022 STI PROFILE HBsAg screen Negative   negative   Not Available [facility]) [address] [place], [place], [place], [address], Ph [phone] 07/12/2022 05:53:47  07/11/2022 07/12/2022 STI PROFILE hep B surface Ab, qual  Reactive       Not Available [facility]) [address] [place], [place], [place], [address], Ph [phone] 07/12/2022 05:53:47  07/11/2022 07/12/2022 STI PROFILE hep B core Ab, tot Negative   negative   Not Available [facility]) [address] [place], [place], [place], [address], Ph [phone] 07/12/2022 05:53:47  07/11/2022 07/12/2022 STI PROFILE rfx to hbc IgM Comment       Not Available [facility]) [address] [place], [place], [place], [address], Ph [phone] 07/12/2022 05:53:47  07/11/2022 07/12/2022 STI PROFILE HCV Ab Non Reactive   non reactive   Not Available [facility]) [address] [place], [place], [place], [address], Ph [phone] 07/12/2022 05:53:47  07/11/2022 07/12/2022 STI PROFILE interpretation: Comment       Not Available [facility]) [address] [place], [place], [place], [address], Ph [phone] 07/12/2022 05:53:47  07/11/2022 07/12/2022 STI PROFILE RPR Non Reactive   non reactive   Not Available [facility]) [address] [place], [place], [place], [address], Ph [phone] 07/12/2022 05:53:47  07/11/2022 07/12/2022 STI PROFILE HIV Ab/P24 Ag screen Non Reactive   non reactive   Not Available [facility]) [address] [place], [place], [place], [address], Ph [phone] 07/12/2022 05:53:47  07/11/2022 07/12/2022 TSH+FREE T4 TSH 1.850 uIU/mL 0.450-4.500   Not Available [facility]) [address] [place], [place], [place], [address], Ph [phone] 07/12/2022 05:53:47  07/11/2022 07/12/2022 TSH+FREE T4 T4,free(direct) 1.19 NG/dL 4.31-5.40   Not Available [facility]) [address] [place], [place], [place], [address], Ph [phone] 07/12/2022 05:53:47  07/11/2022 07/12/2022 COMP. METABOLIC PANEL (14) glucose 90 mg/dL 24-58   Not Available [facility]) [address] [place], [place], [address], Ph [phone] 07/12/2022 05:53:48  07/11/2022 07/12/2022 COMP. METABOLIC PANEL (14) BUN 10  mg/dL 7-34   Not Available [facility]) [address], [place], [place], [address], Ph [phone] 07/12/2022 05:53:48  07/11/2022 07/12/2022 COMP. METABOLIC PANEL (14) creatinine 0.62 mg/dL 9.92-4.26   Not Available [facility]) [address] [place], [place], [address], Ph [phone] 07/12/2022 05:53:48  07/11/2022 07/12/2022 COMP. METABOLIC PANEL (14) eGFR 128 mL/min/1.73 >59   Not Available [facility]) [address], [place], [place], [address], Ph [phone] 07/12/2022 05:53:48  07/11/2022 07/12/2022 COMP. METABOLIC PANEL (14) BUN/creatinine ratio 16   9-23   Not Available [facility]) [address], [place], [place], [address], Ph [phone] 07/12/2022 05:53:48  07/11/2022 07/12/2022 COMP. METABOLIC PANEL (14) sodium 139 mmol/L 134-144   Not Available [facility]) [address] [place], [place], [address], Ph [phone] 07/12/2022 05:53:48  07/11/2022 07/12/2022 COMP. METABOLIC PANEL (14) potassium 4.0 mmol/L  3.5-5.2   Not Available [facility]) [address], [place], [place], [address], Ph [phone] 07/12/2022 05:53:48  07/11/2022 07/12/2022 COMP. METABOLIC PANEL (14) chloride 101 mmol/L 96-106   Not Available [facility]) [address] [place], [place], [address], Ph [phone] 07/12/2022 05:53:48  07/11/2022 07/12/2022 COMP. METABOLIC PANEL (14) carbon dioxide, total 23 mmol/L 20-29   Not Available [facility]) [address], [place], [place], [address], Ph [phone] 07/12/2022 05:53:48  07/11/2022 07/12/2022 COMP. METABOLIC PANEL (14) calcium 9.6 mg/dL 0.6-30.1   Not Available [facility]) [address], [place], [place], [address], Ph [phone] 07/12/2022 05:53:48  07/11/2022 07/12/2022 COMP. METABOLIC PANEL (14) protein, total 7.6 g/dL 2.5-4.2   Not Available [facility]) [address] [place], [place], [address], Ph [phone] 07/12/2022 05:53:48  07/11/2022 07/12/2022 COMP. METABOLIC PANEL (14) albumin 4.8 g/dL 1.6-0.7   Not Available [facility]) [address], [place], [place], [address], Ph [phone] 07/12/2022 05:53:48  07/11/2022 07/12/2022 COMP. METABOLIC PANEL (14) globulin, total 2.8 g/dL 0.3-5.0   Not Available [facility]) [address] [place], [place], [address], Ph [phone] 07/12/2022 05:53:48  07/11/2022 07/12/2022 COMP. METABOLIC PANEL (14) A/G ratio 1.7   1.2-2.2   Not Available [facility]) [address], [place], [place], [address], Ph [phone] 07/12/2022 05:53:48  07/11/2022 07/12/2022 COMP. METABOLIC PANEL (14) bilirubin, total 1.1 mg/dL 5.2-7.7   Not Available [facility]) [address] [place], [place], [address], Ph [phone] 07/12/2022 05:53:48  07/11/2022 07/12/2022 COMP. METABOLIC PANEL (14) alkaline phosphatase 52 IU/L 44-121   Not Available [facility]) [address] [place], [place], [address], Ph [phone] 07/12/2022 05:53:48  07/11/2022 07/12/2022 COMP. METABOLIC PANEL (14) AST (SGOT) 24 IU/L 0-40   Not Available [facility]) [address] [place], [place], [address], Ph [phone] 07/12/2022 05:53:48  07/11/2022 07/12/2022 COMP. METABOLIC PANEL (14) ALT (SGPT) 17 IU/L 0-32   Not Available [facility]) [address] [place], [place], [address], Ph [phone] 07/12/2022 05:53:48  07/11/2022 07/11/2022 CBC, PLATELET, NO DIFFERENTIAL WBC 5.7 x10e3/uL 3.4-10.8   Not Available [facility]) [address], [place], [place], [address], Ph [phone] 07/12/2022 05:53:48  07/11/2022 07/11/2022 CBC, PLATELET, NO DIFFERENTIAL RBC 3.85 x10e6/uL 3.77-5.28   Not Available [facility]) [address], [place], [place], [address], Ph [phone] 07/12/2022 05:53:48  07/11/2022 07/11/2022 CBC, PLATELET, NO DIFFERENTIAL hemoglobin 11.7 g/dL  19.4-17.4   Not Available [facility]) [address] [place], [place], [address], Ph [phone] 07/12/2022 05:53:48  07/11/2022 07/11/2022 CBC, PLATELET, NO DIFFERENTIAL hematocrit 35.0 % 34.0-46.6   Not Available [facility]) [address] [place], [place], [address], Ph [phone] 07/12/2022 05:53:48  07/11/2022 07/11/2022 CBC, PLATELET, NO DIFFERENTIAL MCV 91 fL 79-97   Not Available [facility]) [address], [place], [place], [address], Ph [phone] 07/12/2022 05:53:48  07/11/2022 07/11/2022 CBC, PLATELET, NO DIFFERENTIAL MCH 30.4 pg 26.6-33.0   Not Available [facility]) [address], [place], [place], [address], Ph [phone] 07/12/2022 05:53:48  07/11/2022 07/11/2022 CBC, PLATELET, NO DIFFERENTIAL MCHC 33.4 g/dL 50.3-54.6   Not Available [facility]) [address] [place], [place], [address], Ph [phone] 07/12/2022 05:53:48  07/11/2022 07/11/2022 CBC, PLATELET, NO DIFFERENTIAL RDW 12.1 %  11.7-15.4   Not Available [facility]) [address] [place], [place], [address], Ph [phone] 07/12/2022 05:53:48  07/11/2022 07/11/2022 CBC, PLATELET, NO DIFFERENTIAL platelets 215 x10e3/uL 150-450   Not Available [facility]) [address], [place], [place], [address], Ph [phone] 07/12/2022 05:53:48  07/11/2022 07/11/2022 CBC, PLATELET, NO DIFFERENTIAL NRBC NP       Not Available [facility]) [address] [place], [place], [address], Ph [phone] 07/12/2022 05:53:48  07/11/2022 07/12/2022 LIPID PANEL WITH LDL/HDL RATIO cholesterol, total 166 mg/dL 244-010   Not Available [facility]) [address], [place], [place], [address], Ph [phone] 07/12/2022 05:53:49  07/11/2022 07/12/2022 LIPID PANEL WITH LDL/HDL RATIO triglycerides 53 mg/dL 5-956   Not Available [facility]) [address], [place], [place], [address], Ph [phone]  07/12/2022 05:53:49  07/11/2022 07/12/2022 LIPID PANEL WITH LDL/HDL RATIO HDL cholesterol 69 mg/dL >16   Not Available [facility]) [address] [place], [place], [address], Ph [phone] 07/12/2022 05:53:49  07/11/2022 07/12/2022 LIPID PANEL WITH LDL/HDL RATIO VLDL cholesterol cal 11 mg/dL 5-73   Not Available [facility]) [address] [place], [place], [address], Ph [phone] 07/12/2022 05:53:49  07/11/2022 07/12/2022 LIPID PANEL WITH LDL/HDL RATIO LDL chol calc (nih) 86 mg/dL 8-31   Not Available [facility]) [address] [place], [place], [address], Ph [phone] 07/12/2022 05:53:49  07/11/2022 07/12/2022 LIPID PANEL WITH LDL/HDL RATIO comment: NP       Not Available [facility]) [address] [place], [place], [address], Ph [phone] 07/12/2022 05:53:49  07/11/2022 07/12/2022 LIPID PANEL WITH LDL/HDL RATIO LDL/HDL ratio 1.2 ratio 0.0-3.2   Not Available [facility]) [address], [place], [place], [address], Ph [phone] 07/12/2022 05:53:49  07/11/2022 07/11/2022 HGB A1C WITH EAG ESTIMATION hemoglobin A1C 5.4 % 4.8-5.6   Not Available [facility]) [address], [place], [place], [address], Ph [phone] 07/12/2022 05:53:50  07/11/2022 07/11/2022 HGB A1C WITH EAG ESTIMATION estim. avg glu (EAG) 108 mg/dL     Not Available [facility]) [address] [place], [place], [address], Ph [phone] 07/12/2022 05:53:50  07/11/2022 07/12/2022 VITAMIN D, 25-HYDROXY vitamin D, 25-hydroxy 28.3 NG/mL 30.0-100.0 below low normal Not Available [facility]) [address] [place], [place], [address], Ph [phone] 07/12/2022 05:53:50  07/11/2022 07/11/2022 CT/NG + [facility] other info Other Information       Not Available [facility] [address] [place], [place], [address], Ph [phone] 07/12/2022 13:29:53  07/11/2022 07/12/2022 CT/NG + [facility]  urogenital,swab/transport tube         Not Available [facility] [address] [place], [place], [address], Ph [phone] 07/12/2022 13:29:53  07/11/2022 07/12/2022 CT/NG + [facility] chlamydia result Negative       Not Available [facility] [address] [place], [place], [address], Ph [phone] 07/12/2022 13:29:53  07/11/2022 07/12/2022 CT/NG + [facility] gonorrhea result Negative       Not Available [facility] [address] [place], [place], [address], Ph [phone] 07/12/2022 13:29:53  07/11/2022 07/12/2022 CT/NG + TRICH trichomonas result Negative

## 2022-12-21 DIAGNOSIS — Z419 Encounter for procedure for purposes other than remedying health state, unspecified: Secondary | ICD-10-CM | POA: Diagnosis not present

## 2023-01-01 ENCOUNTER — Institutional Professional Consult (permissible substitution): Payer: Medicaid Other | Admitting: Clinical

## 2023-01-21 DIAGNOSIS — Z419 Encounter for procedure for purposes other than remedying health state, unspecified: Secondary | ICD-10-CM | POA: Diagnosis not present

## 2023-01-22 DIAGNOSIS — R519 Headache, unspecified: Secondary | ICD-10-CM | POA: Diagnosis not present

## 2023-01-22 DIAGNOSIS — Z20822 Contact with and (suspected) exposure to covid-19: Secondary | ICD-10-CM | POA: Diagnosis not present

## 2023-01-22 DIAGNOSIS — B349 Viral infection, unspecified: Secondary | ICD-10-CM | POA: Diagnosis not present

## 2023-01-22 DIAGNOSIS — R509 Fever, unspecified: Secondary | ICD-10-CM | POA: Diagnosis not present

## 2023-01-25 DIAGNOSIS — R509 Fever, unspecified: Secondary | ICD-10-CM | POA: Diagnosis not present

## 2023-01-25 DIAGNOSIS — Z20822 Contact with and (suspected) exposure to covid-19: Secondary | ICD-10-CM | POA: Diagnosis not present

## 2023-01-25 DIAGNOSIS — J019 Acute sinusitis, unspecified: Secondary | ICD-10-CM | POA: Diagnosis not present

## 2023-02-20 DIAGNOSIS — Z419 Encounter for procedure for purposes other than remedying health state, unspecified: Secondary | ICD-10-CM | POA: Diagnosis not present

## 2023-03-23 DIAGNOSIS — Z419 Encounter for procedure for purposes other than remedying health state, unspecified: Secondary | ICD-10-CM | POA: Diagnosis not present

## 2023-03-29 ENCOUNTER — Ambulatory Visit
Admission: EM | Admit: 2023-03-29 | Discharge: 2023-03-29 | Disposition: A | Discharge status: Home / Self Care | Payer: Medicaid Other | Attending: Internal Medicine | Admitting: Internal Medicine

## 2023-03-29 DIAGNOSIS — Z113 Encounter for screening for infections with a predominantly sexual mode of transmission: Secondary | ICD-10-CM | POA: Diagnosis not present

## 2023-03-29 LAB — OB RESULTS CONSOLE GC/CHLAMYDIA: Chlamydia: NEGATIVE

## 2023-03-29 NOTE — ED Triage Notes (Signed)
Pt presents for STD screening, request blood work.

## 2023-03-29 NOTE — Discharge Instructions (Signed)
The clinic will contact you for any positive results of the testing done today.  Follow-up as needed.

## 2023-03-29 NOTE — ED Provider Notes (Signed)
[facility]    CSN: [id number] Arrival date & time: 03/29/23  1234      History   Chief Complaint Chief Complaint  Patient presents with   SEXUALLY TRANSMITTED DISEASE    HPI PATRCIA Medina is a 22 y.o. female presents for STD screening.  She currently denies any symptoms including dysuria, vaginal discharge, fevers, nausea/vomiting, flank pain.  No STD exposure.  No other concerns at this time.  HPI  History reviewed. No pertinent past medical history.  Patient Active Problem List   Diagnosis Date Noted   Anemia of pregnancy 12/13/2022   Anxiety and depression 12/13/2022   Fatigue 12/13/2022   Normal labor and delivery 12/22/2019    Past Surgical History:  Procedure Laterality Date   DILATION AND CURETTAGE OF UTERUS      OB History     Gravida  2   Para  1   Term      Preterm  1   AB  1   Living  1      SAB      IAB      Ectopic      Multiple  0   Live Births  1            Home Medications    Prior to Admission medications   Not on File    Family History History reviewed. No pertinent family history.  Social History Social History   Tobacco Use   Smoking status: Never    Passive exposure: Never   Smokeless tobacco: Never  Vaping Use   Vaping status: Never Used  Substance Use Topics   Alcohol use: Not Currently    Comment: occasionally   Drug use: Never     Allergies   Patient has no allergy information on record.   Review of Systems Review of Systems  Genitourinary:        STD screening     Physical Exam Triage Vital Signs ED Triage Vitals  Encounter Vitals Group     BP 03/29/23 1249 (!) 130/90     Systolic BP Percentile --      Diastolic BP Percentile --      Pulse Rate 03/29/23 1249 65     Resp 03/29/23 1249 17     Temp 03/29/23 1249 99.1 F (37.3 C)     Temp Source 03/29/23 1249 Oral     SpO2 03/29/23 1249 95 %     Weight --      Height --      Head Circumference --      Peak  Flow --      Pain Score 03/29/23 1248 0     Pain Loc --      Pain Education --      Exclude from Growth Chart --    No data found.  Updated Vital Signs BP (!) 130/90 (BP Location: Left Arm)   Pulse 65   Temp 99.1 F (37.3 C) (Oral)   Resp 17   LMP 03/22/2023 (Exact Date)   SpO2 95%   Visual Acuity Right Eye Distance:   Left Eye Distance:   Bilateral Distance:    Right Eye Near:   Left Eye Near:    Bilateral Near:     Physical Exam Vitals and nursing note reviewed.  Constitutional:      Appearance: Normal appearance.  HENT:     Head: Normocephalic and atraumatic.  Eyes:     Pupils: Pupils  are equal, round, and reactive to light.  Cardiovascular:     Rate and Rhythm: Normal rate.  Pulmonary:     Effort: Pulmonary effort is normal.  Abdominal:     Tenderness: There is no right CVA tenderness or left CVA tenderness.  Skin:    General: Skin is warm and dry.  Neurological:     General: No focal deficit present.     Mental Status: She is alert and oriented to person, place, and time.  Psychiatric:        Mood and Affect: Mood normal.        Behavior: Behavior normal.      UC Treatments / Results  Labs (all labs ordered are listed, but only abnormal results are displayed) Labs Reviewed  RPR  HIV ANTIBODY (ROUTINE TESTING W REFLEX)  CERVICOVAGINAL ANCILLARY ONLY    EKG   Radiology No results found.  Procedures Procedures (including critical care time)  Medications Ordered in UC Medications - No data to display  Initial Impression / Assessment and Plan / UC Course  I have reviewed the triage vital signs and the nursing notes.  Pertinent labs & imaging results that were available during my care of the patient were reviewed by me and considered in my medical decision making (see chart for details).     STD testing is ordered we will contact for any positive results.  Follow-up as needed. Final Clinical Impressions(s) / UC Diagnoses   Final  diagnoses:  Screening examination for STD (sexually transmitted disease)     Discharge Instructions      The clinic will contact you for any positive results of the testing done today.  Follow-up as needed.   ED Prescriptions   None    PDMP not reviewed this encounter.   Radford Pax, NP 03/29/23 1308

## 2023-03-30 LAB — HIV ANTIBODY (ROUTINE TESTING W REFLEX): HIV Screen 4th Generation wRfx: NONREACTIVE

## 2023-03-30 LAB — CERVICOVAGINAL ANCILLARY ONLY
Chlamydia: NEGATIVE
Comment: NEGATIVE
Comment: NEGATIVE
Comment: NORMAL
Neisseria Gonorrhea: NEGATIVE
Trichomonas: NEGATIVE

## 2023-03-30 LAB — RPR: RPR Ser Ql: NONREACTIVE

## 2023-04-22 DIAGNOSIS — Z419 Encounter for procedure for purposes other than remedying health state, unspecified: Secondary | ICD-10-CM | POA: Diagnosis not present

## 2023-05-23 DIAGNOSIS — Z419 Encounter for procedure for purposes other than remedying health state, unspecified: Secondary | ICD-10-CM | POA: Diagnosis not present

## 2023-05-25 ENCOUNTER — Ambulatory Visit
Admission: RE | Admit: 2023-05-25 | Discharge: 2023-05-25 | Disposition: A | Discharge status: Home / Self Care | Payer: Medicaid Other | Source: Ambulatory Visit | Attending: Family Medicine

## 2023-05-25 VITALS — BP 151/90 | HR 74 | Temp 98.9°F | Resp 16

## 2023-05-25 DIAGNOSIS — Z113 Encounter for screening for infections with a predominantly sexual mode of transmission: Secondary | ICD-10-CM

## 2023-05-25 LAB — POCT URINE PREGNANCY: Preg Test, Ur: NEGATIVE

## 2023-05-25 NOTE — ED Provider Notes (Signed)
  [facility]  Note:  This document was prepared using Conservation officer, historic buildings and may include unintentional dictation errors.  MRN: [id number] DOB: 2001/01/29  Subjective:   Cheyenne Medina is a 23 y.o. female presenting for STI screening. Denies fever, n/v, abdominal pain, pelvic pain, rashes, dysuria, urinary frequency, hematuria, vaginal discharge.  No known exposures.   No current facility-administered medications for this encounter. No current outpatient medications on file.   No Known Allergies  History reviewed. No pertinent past medical history.   Past Surgical History:  Procedure Laterality Date   DILATION AND CURETTAGE OF UTERUS      No family history on file.  Social History   Tobacco Use   Smoking status: Never    Passive exposure: Never   Smokeless tobacco: Never  Vaping Use   Vaping status: Never Used  Substance Use Topics   Alcohol use: Yes    Comment: occasionally   Drug use: Never    ROS   Objective:   Vitals: BP (!) 151/90 (BP Location: Left Arm)   Pulse 74   Temp 98.9 F (37.2 C) (Oral)   Resp 16   LMP 05/10/2023   SpO2 99%   Physical Exam Constitutional:      General: She is not in acute distress.    Appearance: Normal appearance. She is well-developed. She is not ill-appearing, toxic-appearing or diaphoretic.  HENT:     Head: Normocephalic and atraumatic.     Nose: Nose normal.     Mouth/Throat:     Mouth: Mucous membranes are moist.  Eyes:     General: No scleral icterus.       Right eye: No discharge.        Left eye: No discharge.     Extraocular Movements: Extraocular movements intact.  Cardiovascular:     Rate and Rhythm: Normal rate.  Pulmonary:     Effort: Pulmonary effort is normal.  Skin:    General: Skin is warm and dry.  Neurological:     General: No focal deficit present.     Mental Status: She is alert and oriented to person, place, and time.  Psychiatric:        Mood  and Affect: Mood normal.        Behavior: Behavior normal.     Results for orders placed or performed during the hospital encounter of 05/25/23 (from the past 24 hours)  POCT urine pregnancy     Status: None   Collection Time: 05/25/23  6:05 PM  Result Value Ref Range   Preg Test, Ur Negative Negative    Assessment and Plan :   PDMP not reviewed this encounter.  1. Screen for STD (sexually transmitted disease)    STI check pending, will treat as appropriate based off of lab results.   Christopher Savannah, [place] 05/25/23 8153

## 2023-05-25 NOTE — ED Triage Notes (Signed)
 Pt requesting STD testing-denies sx and known exposure-NAD-steady gait

## 2023-05-26 LAB — HIV ANTIBODY (ROUTINE TESTING W REFLEX): HIV Screen 4th Generation wRfx: NONREACTIVE

## 2023-05-26 LAB — RPR: RPR Ser Ql: NONREACTIVE

## 2023-05-28 LAB — CERVICOVAGINAL ANCILLARY ONLY
Chlamydia: NEGATIVE
Comment: NEGATIVE
Comment: NEGATIVE
Comment: NORMAL
Neisseria Gonorrhea: NEGATIVE
Trichomonas: NEGATIVE

## 2023-06-10 DIAGNOSIS — T192XXA Foreign body in vulva and vagina, initial encounter: Secondary | ICD-10-CM | POA: Diagnosis not present

## 2023-06-19 DIAGNOSIS — Z20822 Contact with and (suspected) exposure to covid-19: Secondary | ICD-10-CM | POA: Diagnosis not present

## 2023-06-19 DIAGNOSIS — J101 Influenza due to other identified influenza virus with other respiratory manifestations: Secondary | ICD-10-CM | POA: Diagnosis not present

## 2023-06-19 DIAGNOSIS — R059 Cough, unspecified: Secondary | ICD-10-CM | POA: Diagnosis not present

## 2023-06-23 DIAGNOSIS — Z419 Encounter for procedure for purposes other than remedying health state, unspecified: Secondary | ICD-10-CM | POA: Diagnosis not present

## 2023-07-16 ENCOUNTER — Ambulatory Visit (INDEPENDENT_AMBULATORY_CARE_PROVIDER_SITE_OTHER): Payer: Medicaid Other | Admitting: Nurse Practitioner

## 2023-07-16 VITALS — BP 111/63 | HR 76 | Temp 98.4°F | Wt 120.0 lb

## 2023-07-16 DIAGNOSIS — F32A Depression, unspecified: Secondary | ICD-10-CM | POA: Diagnosis not present

## 2023-07-16 DIAGNOSIS — F419 Anxiety disorder, unspecified: Secondary | ICD-10-CM

## 2023-07-16 DIAGNOSIS — Z0000 Encounter for general adult medical examination without abnormal findings: Secondary | ICD-10-CM | POA: Diagnosis not present

## 2023-07-16 DIAGNOSIS — Z130 Encounter for screening for diseases of the blood and blood-forming organs and certain disorders involving the immune mechanism: Secondary | ICD-10-CM | POA: Diagnosis not present

## 2023-07-16 DIAGNOSIS — Z1329 Encounter for screening for other suspected endocrine disorder: Secondary | ICD-10-CM | POA: Diagnosis not present

## 2023-07-16 DIAGNOSIS — Z1321 Encounter for screening for nutritional disorder: Secondary | ICD-10-CM

## 2023-07-16 DIAGNOSIS — Z13228 Encounter for screening for other metabolic disorders: Secondary | ICD-10-CM

## 2023-07-16 NOTE — Assessment & Plan Note (Signed)
 Annual exam as documented.  Counseling done include healthy lifestyle involving committing to 150 minutes of exercise per week, heart healthy diet, and attaining healthy weight. The importance of adequate sleep also discussed.  Regular use of seat belt and home safety were also discussed . Changes in health habits are decided on by patient with goals and time frames set for achieving them. Immunization and cancer screening  needs are specifically addressed at this visit.    . Screening for endocrine, nutritional, metabolic and immunity disorder  - VITAMIN D 25 Hydroxy (Vit-D Deficiency, Fractures); Future - CBC; Future - CMP14+EGFR; Future - Hepatitis C antibody; Future - Lipid panel; Future

## 2023-07-16 NOTE — Assessment & Plan Note (Signed)
    07/16/2023    3:57 PM 12/13/2022    3:40 PM  Depression screen PHQ 2/9  Decreased Interest 0 2  Down, Depressed, Hopeless 0 2  PHQ - 2 Score 0 4  Altered sleeping 0 3  Tired, decreased energy 0 3  Change in appetite 0 2  Feeling bad or failure about yourself  0 0  Trouble concentrating 0 3  Moving slowly or fidgety/restless 0 3  Suicidal thoughts 0 0  PHQ-9 Score 0 18  Difficult doing work/chores Not difficult at all Very difficult

## 2023-07-16 NOTE — Progress Notes (Signed)
 Complete physical exam  Patient: Cheyenne Medina   DOB: 03-27-2001   22 y.o. Female  MRN: [id number]  Subjective:    Chief Complaint  Patient presents with   Annual Exam    Cheyenne Medina is a 23 y.o. female with past medical history of anxiety and depression who presents today for a complete physical exam. She reports consuming a general diet. The patient does not participate in regular exercise at present. She generally feels well. She reports sleeping well. She does not have additional problems to discuss today.   Plans to get her cervical cancer screening done at the [facility]. Patient encouraged to get HPV vaccine if not up-to-date      Most recent fall risk assessment:     No data to display           Most recent depression screenings:    07/16/2023    3:57 PM 12/13/2022    3:40 PM  PHQ 2/9 Scores  PHQ - 2 Score 0 4  PHQ- 9 Score 0 18        Patient Care Team: Donell Beers, FNP as PCP - General (Nurse Practitioner)   Outpatient Medications Prior to Visit  Medication Sig   Multiple Vitamin (MULTIVITAMIN) tablet Take 1 tablet by mouth daily.   No facility-administered medications prior to visit.    Review of Systems  Constitutional:  Negative for appetite change, chills, fatigue and fever.  HENT:  Negative for congestion, postnasal drip, rhinorrhea and sneezing.   Eyes:  Negative for pain, discharge, itching and visual disturbance.  Respiratory:  Negative for cough, shortness of breath and wheezing.   Cardiovascular:  Negative for chest pain, palpitations and leg swelling.  Gastrointestinal:  Negative for abdominal pain, constipation, nausea and vomiting.  Endocrine: Negative for polydipsia, polyphagia and polyuria.  Genitourinary:  Negative for difficulty urinating, dysuria, flank pain and frequency.  Musculoskeletal:  Negative for arthralgias, back pain, joint swelling and myalgias.  Skin:  Negative for color change,  pallor, rash and wound.  Allergic/Immunologic: Negative for food allergies and immunocompromised state.  Neurological:  Negative for dizziness, facial asymmetry, weakness, numbness and headaches.  Psychiatric/Behavioral:  Negative for behavioral problems, confusion, self-injury and suicidal ideas.        Objective:     BP 111/63   Pulse 76   Temp 98.4 F (36.9 C)   Wt 120 lb (54.4 kg)   SpO2 100%   BMI 23.44 kg/m    Physical Exam Vitals and nursing note reviewed.  Constitutional:      General: She is not in acute distress.    Appearance: Normal appearance. She is not ill-appearing, toxic-appearing or diaphoretic.  HENT:     Right Ear: Tympanic membrane, ear canal and external ear normal. There is no impacted cerumen.     Left Ear: Tympanic membrane, ear canal and external ear normal. There is no impacted cerumen.     Nose: Nose normal. No congestion or rhinorrhea.     Mouth/Throat:     Mouth: Mucous membranes are moist.     Pharynx: Oropharynx is clear. No oropharyngeal exudate or posterior oropharyngeal erythema.  Eyes:     General: No scleral icterus.       Right eye: No discharge.        Left eye: No discharge.     Extraocular Movements: Extraocular movements intact.     Conjunctiva/sclera: Conjunctivae normal.  Neck:     Vascular: No carotid bruit.  Cardiovascular:     Rate and Rhythm: Normal rate and regular rhythm.     Pulses: Normal pulses.     Heart sounds: Normal heart sounds. No murmur heard.    No friction rub. No gallop.  Pulmonary:     Effort: Pulmonary effort is normal. No respiratory distress.     Breath sounds: Normal breath sounds. No stridor. No wheezing, rhonchi or rales.  Chest:     Chest wall: No tenderness.  Abdominal:     General: Bowel sounds are normal. There is no distension.     Palpations: Abdomen is soft. There is no mass.     Tenderness: There is no abdominal tenderness. There is no right CVA tenderness, left CVA tenderness, guarding  or rebound.     Hernia: No hernia is present.  Musculoskeletal:        General: No swelling, tenderness, deformity or signs of injury.     Cervical back: Normal range of motion and neck supple. No rigidity or tenderness.     Right lower leg: No edema.     Left lower leg: No edema.  Lymphadenopathy:     Cervical: No cervical adenopathy.  Skin:    General: Skin is warm and dry.     Capillary Refill: Capillary refill takes less than 2 seconds.     Coloration: Skin is not jaundiced or pale.     Findings: No bruising, erythema, lesion or rash.  Neurological:     Mental Status: She is alert and oriented to person, place, and time.     Cranial Nerves: No cranial nerve deficit.     Sensory: No sensory deficit.     Motor: No weakness.     Coordination: Coordination normal.     Gait: Gait normal.     Deep Tendon Reflexes: Reflexes normal.  Psychiatric:        Mood and Affect: Mood normal.        Behavior: Behavior normal.        Thought Content: Thought content normal.        Judgment: Judgment normal.     No results found for any visits on 07/16/23.     Assessment & Plan:    Routine Health Maintenance and Physical Exam  Immunization History  Administered Date(s) Administered   Influenza Inj Mdck Quad With Preservative 01/20/2018, 03/23/2019   Tdap 10/24/2019, 10/04/2020    Health Maintenance  Topic Date Due   HPV VACCINES (1 - 3-dose series) Never done   Hepatitis C Screening  Never done   Cervical Cancer Screening (Pap smear)  01/21/2023   COVID-19 Vaccine (1 - 2024-25 season) Never done   INFLUENZA VACCINE  08/20/2023 (Originally 12/21/2022)   CHLAMYDIA SCREENING  05/24/2024   DTaP/Tdap/Td (3 - Td or Tdap) 10/05/2030   HIV Screening  Completed    Discussed health benefits of physical activity, and encouraged her to engage in regular exercise appropriate for her age and condition.  Problem List Items Addressed This Visit       Other   Anxiety and depression       07/16/2023    3:57 PM 12/13/2022    3:40 PM  Depression screen PHQ 2/9  Decreased Interest 0 2  Down, Depressed, Hopeless 0 2  PHQ - 2 Score 0 4  Altered sleeping 0 3  Tired, decreased energy 0 3  Change in appetite 0 2  Feeling bad or failure about yourself  0 0  Trouble concentrating 0 3  Moving slowly or fidgety/restless 0 3  Suicidal thoughts 0 0  PHQ-9 Score 0 18  Difficult doing work/chores Not difficult at all Very difficult         Annual physical exam - Primary   Annual exam as documented.  Counseling done include healthy lifestyle involving committing to 150 minutes of exercise per week, heart healthy diet, and attaining healthy weight. The importance of adequate sleep also discussed.  Regular use of seat belt and home safety were also discussed . Changes in health habits are decided on by patient with goals and time frames set for achieving them. Immunization and cancer screening  needs are specifically addressed at this visit.    . Screening for endocrine, nutritional, metabolic and immunity disorder  - VITAMIN D 25 Hydroxy (Vit-D Deficiency, Fractures); Future - CBC; Future - CMP14+EGFR; Future - Hepatitis C antibody; Future - Lipid panel; Future       Other Visit Diagnoses       Screening for endocrine, nutritional, metabolic and immunity disorder       Relevant Orders   VITAMIN D 25 Hydroxy (Vit-D Deficiency, Fractures)   CBC   CMP14+EGFR   Hepatitis C antibody   Lipid panel      Return in about 1 year (around 07/15/2024) for CPE.     Donell Beers, FNP

## 2023-07-16 NOTE — Patient Instructions (Signed)

## 2023-07-21 DIAGNOSIS — Z419 Encounter for procedure for purposes other than remedying health state, unspecified: Secondary | ICD-10-CM | POA: Diagnosis not present

## 2023-09-01 DIAGNOSIS — Z419 Encounter for procedure for purposes other than remedying health state, unspecified: Secondary | ICD-10-CM | POA: Diagnosis not present

## 2023-09-05 ENCOUNTER — Ambulatory Visit: Admission: EM | Admit: 2023-09-05 | Discharge: 2023-09-05 | Disposition: A | Discharge status: Home / Self Care

## 2023-09-05 DIAGNOSIS — O99119 Other diseases of the blood and blood-forming organs and certain disorders involving the immune mechanism complicating pregnancy, unspecified trimester: Secondary | ICD-10-CM

## 2023-09-05 DIAGNOSIS — Z113 Encounter for screening for infections with a predominantly sexual mode of transmission: Secondary | ICD-10-CM | POA: Diagnosis not present

## 2023-09-05 DIAGNOSIS — A749 Chlamydial infection, unspecified: Secondary | ICD-10-CM | POA: Insufficient documentation

## 2023-09-05 DIAGNOSIS — R772 Abnormality of alphafetoprotein: Secondary | ICD-10-CM | POA: Insufficient documentation

## 2023-09-05 DIAGNOSIS — Q000 Anencephaly: Secondary | ICD-10-CM | POA: Insufficient documentation

## 2023-09-05 HISTORY — DX: Other diseases of the blood and blood-forming organs and certain disorders involving the immune mechanism complicating pregnancy, unspecified trimester: O99.119

## 2023-09-05 HISTORY — DX: Chlamydial infection, unspecified: A74.9

## 2023-09-05 LAB — POCT URINE PREGNANCY: Preg Test, Ur: NEGATIVE

## 2023-09-05 NOTE — ED Triage Notes (Signed)
"  I just want STI screening done, Swab and blood work if possible". No symptoms or exposure known. Discussed blood work with patient & recent HIV/RPR being done in Jan. 2025.

## 2023-09-05 NOTE — ED Provider Notes (Signed)
 [facility]    CSN: [id number] Arrival date & time: 09/05/23  1247      History   Chief Complaint Chief Complaint  Patient presents with   SEXUALLY TRANSMITTED DISEASE    Testing    HPI Cheyenne Medina is a 23 y.o. female.   Patient here today for STD screening.  She denies any symptoms or known exposures.  She would like pregnancy screening as well.  The history is provided by the patient.    History reviewed. No pertinent past medical history.  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 Authorizing Provider  oseltamivir (TAMIFLU) 75 MG capsule Take 75 mg by mouth 2 (two) times daily. 06/19/23  Yes [provider]  promethazine-dextromethorphan (PROMETHAZINE-DM) 6.25-15 MG/5ML syrup Take 5 mLs by mouth 4 (four) times daily as needed. 06/19/23  Yes [provider]  Multiple Vitamin (MULTIVITAMIN) tablet Take 1 tablet by mouth daily.    [provider]    Family History History reviewed. No pertinent family history.  Social History Social History   Tobacco Use   Smoking  status: Never    Passive exposure: Never   Smokeless tobacco: Never  Vaping Use   Vaping status: Never Used  Substance Use Topics   Alcohol use: Yes    Comment: occasionally   Drug use: Never     Allergies   Patient has no known allergies.   Review of Systems Review of Systems  Constitutional:  Negative for chills and fever.  Eyes:  Negative for discharge and redness.  Respiratory:  Negative for shortness of breath.   Gastrointestinal:  Negative for abdominal pain, nausea and vomiting.  Genitourinary:  Negative for vaginal discharge.     Physical Exam Triage Vital Signs ED Triage Vitals  Encounter Vitals Group     BP      Systolic BP Percentile      Diastolic BP Percentile      Pulse      Resp      Temp      Temp src      SpO2      Weight      Height      Head Circumference      Peak Flow      Pain Score      Pain Loc      Pain  Education      Exclude from Growth Chart    No data found.  Updated Vital Signs BP 119/76 (BP Location: Left Arm)   Pulse 72   Temp 98.9 F (37.2 C) (Oral)   Resp 18   Ht 5' (1.524 m)   Wt 118 lb (53.5 kg)   LMP 08/18/2023 (Exact Date)   SpO2 98%   BMI 23.05 kg/m   Visual Acuity Right Eye Distance:   Left Eye Distance:   Bilateral Distance:    Right Eye Near:   Left Eye Near:    Bilateral Near:     Physical Exam Vitals and nursing note reviewed.  Constitutional:      General: She is not in acute distress.    Appearance: Normal appearance. She is not ill-appearing.  HENT:     Head: Normocephalic and atraumatic.  Eyes:     Conjunctiva/sclera: Conjunctivae normal.  Cardiovascular:     Rate and Rhythm: Normal rate.  Pulmonary:     Effort: Pulmonary effort is normal. No respiratory distress.  Neurological:     Mental Status: She is alert.  Psychiatric:        Mood and Affect: Mood normal.        Behavior: Behavior normal.        Thought Content: Thought content normal.      UC Treatments / Results   Labs (all labs ordered are listed, but only abnormal results are displayed) Labs Reviewed  HIV ANTIBODY (ROUTINE TESTING W REFLEX)  RPR  POCT URINE PREGNANCY  CERVICOVAGINAL ANCILLARY ONLY    EKG   Radiology No results found.  Procedures Procedures (including critical care time)  Medications Ordered in UC Medications - No data to display  Initial Impression / Assessment and Plan / UC Course  I have reviewed the triage vital signs and the nursing notes.  Pertinent labs & imaging results that were available during my care of the patient were reviewed by me and considered in my medical decision making (see chart for details).    Pregnancy test negative.  STD screening ordered.  Will await results for further recommendation but encouraged follow up with any further concerns.   Final Clinical Impressions(s) / UC Diagnoses   Final diagnoses:  Screening examination for STD (sexually transmitted disease)   Discharge Instructions   None    ED Prescriptions   None    PDMP not reviewed this encounter.   Vernestine Gondola, PA-C 09/05/23 [phone]

## 2023-09-06 LAB — RPR: RPR Ser Ql: NONREACTIVE

## 2023-09-06 LAB — CERVICOVAGINAL ANCILLARY ONLY
Bacterial Vaginitis (gardnerella): NEGATIVE
Candida Glabrata: NEGATIVE
Candida Vaginitis: NEGATIVE
Chlamydia: NEGATIVE
Comment: NEGATIVE
Comment: NEGATIVE
Comment: NEGATIVE
Comment: NEGATIVE
Comment: NEGATIVE
Comment: NORMAL
Neisseria Gonorrhea: NEGATIVE
Trichomonas: NEGATIVE

## 2023-09-06 LAB — HIV ANTIBODY (ROUTINE TESTING W REFLEX): HIV Screen 4th Generation wRfx: NONREACTIVE

## 2023-09-21 ENCOUNTER — Ambulatory Visit (INDEPENDENT_AMBULATORY_CARE_PROVIDER_SITE_OTHER): Admitting: Nurse Practitioner

## 2023-09-21 ENCOUNTER — Encounter: Payer: Self-pay | Admitting: Nurse Practitioner

## 2023-09-21 VITALS — BP 114/76 | HR 60 | Temp 97.7°F | Wt 119.0 lb

## 2023-09-21 DIAGNOSIS — F32A Depression, unspecified: Secondary | ICD-10-CM

## 2023-09-21 DIAGNOSIS — F419 Anxiety disorder, unspecified: Secondary | ICD-10-CM

## 2023-09-21 MED ORDER — HYDROXYZINE PAMOATE 25 MG PO CAPS: 25.0000 mg | ORAL_CAPSULE | Freq: Three times a day (TID) | ORAL | 2 refills | Status: AC | PRN

## 2023-09-21 NOTE — Patient Instructions (Signed)
 1. Anxiety   - hydrOXYzine (VISTARIL) 25 MG capsule; Take 1 capsule (25 mg total) by mouth every 8 (eight) hours as needed.  Dispense: 30 capsule; Refill: 2     Behavioral Health Resources:    What if I or someone I know is in crisis?   If you are thinking about harming yourself or having thoughts of suicide, or if you know someone who is, seek help right away.   Call your doctor or mental health care provider.   Call 911 or go to a hospital emergency room to get immediate help, or ask a friend or family member to help you do these things; IF YOU ARE IN [place], YOU MAY GO TO WALK-IN URGENT CARE 24/7 at [facility] (see below)   Call the [place]  National Suicide Prevention Lifeline's toll-free, 24-hour hotline at 1-800-273-TALK 936 886 0102) or TTY: 1-800-799-4 TTY 424-291-2589) to talk to a trained counselor.   If you are in crisis, make sure you are not left alone.    If someone else is in crisis, make sure he or she is not left alone     24 Hour :    [facility]  [address] [place], [place] [address] [phone] or [phone] WALK-IN URGENT CARE 24/7   [facility] Mobile Crisis: [phone]   [place]  National Suicide Hotline: 567-253-2823   [facility] [employer] (Domestic Violence, Rape & Victim Assistance)  (508)009-7278   [facility]  [address] [place], [place]  [address]   [phone] or [phone]    [facility] [employer] [facility]: [phone] (8am-4pm) or [phone] (after hours)          [facility], [address] [place], [place]  [phone] Fax: [phone] [email or web address] *Interpreters available *Accepts all insurance and uninsured for Urgent Care needs *Accepts Medicaid and uninsured for outpatient treatment    [facility]   Mon-Fri:  8am-5pm [address], [place], [place] [phone](ZHGDJ); [phone](fax) [email or web address]  *Accepts Medicare   [facility] [place], Fri: 8am-4pm [address], [place], [place] [address] [phone] (phone); [phone] (fax) ExShows.[email or web address]  *Accepts Medicare   [facility] Mon-Fri: 9am-5pm  [address], [place], [place] [phone] (phone); [phone]  [email or web address]  *Accepts Medicaid   [facility] [place], 8:30am-12pm/1pm-2:30pm [address] [place], [place] [phone] (phone); [phone] (fax) www.[email or web address]  *Accepts Medicaid, sliding-scale*Bilingual services available   [facility] Mon-Fri, 8am-7pm [address] [place], [place]  [phone](YTKPT); [phone](fax) www.[email or web address]  *Accepts Medicaid *Bilingual services available   [facility] Mon-Fri: 8am-5pm, Saturday by appointment only [address] [place], [place], [place] [phone] (phone); [phone] (fax) [email or web address]    [facility] [address], [place], [place] [phone] [email or web address]  *Free & reduced services for uninsured and underinsured individuals *Bilingual services for Spanish-speaking clients 21 and under   [facility], [address], [place], [place] [phone](ZRAQT); [phone](fax) [email or web address]  *Bring your own interpreter at first visit *Accepts Medicare and [facility]   [facility] Mon-Fri: 9am-5:30pm [address], [place], [place] [phone] (phone), [phone] (fax) After hours crisis line: 2620032018 [email or web address]  *Accepts Medicare and Medicaid   [facility], 8am-6pm [address] [place], [place]  [phone] (phone); [phone] (fax) [email or web address]://[email or web address]  *Subsidized costs available   [facility] Mon-Fri: 8am-4pm [address]  [address], [place], [place] [phone](VZDGL); [phone](fax) www.[email or web address]  *Accepts Medicaid   [facility] Same day access hours: Mon-Fri, 8:30-3pm Crisis hours: Mon-Fri, 8am-5pm [address], [place], [place] [phone]   [facility] Same day access hours: Mon-Fri, 8:30-3pm Crisis hours: Mon-Fri, 8am-8pm [address] [place], [place] [phone] (phone); [phone] (fax) www.[email or web address]  *Accepts Medicaid and Medicare   [facility] [place], [place], Fri: 9am-9pm Tues, Thurs: 9am-6pm [address] [place], [place], [place]  [phone] (phone); [phone] (fax) [email or web address]  *(Accepts Medicare and Medicaid; payment plans available)*Bilingual services available   [facility] [address] [place], [place] [phone] (phone); [phone] (fax) [email or web address]    [facility] [address], [place], [place]  [phone]  [email or web address]  *Bilingual services available   [facility]) Mon-Thurs: 8am-8pm [address], [place], [place] [phone] (phone); [phone] (fax) ScrapbookLive.[email or web address]  *Accepts Medicaid*Bilingual services available   [facility] [address] [place], [place] [phone] (phone) [email or web address]  *Accepts Medicaid *Bilingual services available   [facility] Mon-Fri, 9am-4:45pm [address] [place], [place] [phone] (phone); [phone] (fax) [email or web address]  *Accepts Medicare   [facility] Mon-Thurs: 8:30-8pm, Fri: 8:30am-7pm [address],  [place], [place] (3rd floor) [phone] (phone); [phone] (fax) [email or web address]  *Accepts Medicaid; income-based reduced rates available   [facility] Mon-Fri: 8am-5pm [address], [place], [place] [address] [phone] (phone); [phone] (fax) [email or web address]  *Accepts Medicaid*Bilingual services available     [facility] [place])  [address], [place] [phone] www.[email or web address]  *Provides direct services to individuals in recovery from mental illness, including support groups, recovery skills classes, and one on one peer support   [employer] on Mental Illness) [place] helpline: [phone]  [facility] [place] helpline: [phone] https://[email or web address]  *A community hub for information relating to local resources and services for the friends and families of individuals living alongside a mental health condition, as well as the individuals themselves. Classes and support groups also provided

## 2023-09-21 NOTE — Progress Notes (Signed)
 Established Patient Office Visit  Subjective:  Patient ID: Cheyenne Medina, female    DOB: 2001/01/30  Age: 23 y.o. MRN: [id number]  CC:  Chief Complaint  Patient presents with   Anxiety    HPI Cheyenne Medina is a 23 y.o. female  has a past medical history of Anemia of pregnancy (12/13/2022), Anxiety and depression (12/13/2022), Benign gestational thrombocytopenia (HCC) (09/05/2023), and Chlamydial infection (09/05/2023).  Patient presents for follow-up for anxiety and depression  Anxiety and depression.  States that she randomly feels anxious, she denies depression though her screening was positive for depression, she has been referred for counseling but she did not follow-up due to the cost, she is not interested in counseling at this time,  She is interested in a medication that she can take as needed for anxiety instead of daily.  She denies SI, HI     Past Medical History:  Diagnosis Date   Anemia of pregnancy 12/13/2022   Hgb 9.7 6/4, B12 low, Rx B12 1000mcg QD and recommend PO iron; for iron infusion 07-20.     Anxiety and depression 12/13/2022   Benign gestational thrombocytopenia (HCC) 09/05/2023   142 in MAU; 130 in MAU on 07-12. Repeat at 36 weeks. /     Chlamydial infection 09/05/2023   tx 1g Azith 11/13/2018, TOC 22mo      Past Surgical History:  Procedure Laterality Date   DILATION AND CURETTAGE OF UTERUS      History reviewed. No pertinent family history.  Social History   Socioeconomic History   Marital status: Single    Spouse name: Not on file   Number of children: 1   Years of education: Not on file   Highest education level: Some college, no degree  Occupational History   Not on file  Tobacco Use   Smoking status: Never    Passive exposure: Never   Smokeless tobacco: Never  Vaping Use   Vaping status: Never Used  Substance and Sexual Activity   Alcohol use: Yes    Comment: occasionally   Drug use: Never   Sexual activity: Yes     Birth control/protection: None  Other Topics Concern   Not on file  Social History Narrative   Lives home alone with her child   Social Drivers of Health   Financial Resource Strain: Low Risk  (07/16/2023)   Overall Financial Resource Strain (CARDIA)    Difficulty of Paying Living Expenses: Not very hard  Food Insecurity: No Food Insecurity (07/16/2023)   Hunger Vital Sign    Worried About Running Out of Food in the Last Year: Never true    Ran Out of Food in the Last Year: Never true  Transportation Needs: No Transportation Needs (07/16/2023)   PRAPARE - Administrator, Civil Service (Medical): No    Lack of Transportation (Non-Medical): No  Physical Activity: Unknown (07/16/2023)   Exercise Vital Sign    Days of Exercise per Week: 0 days    Minutes of Exercise per Session: Not on file  Stress: No Stress Concern Present (07/16/2023)   [employer] of Occupational Health - Occupational Stress Questionnaire    Feeling of Stress : Only a little  Social Connections: Unknown (07/16/2023)   Social Connection and Isolation Panel [NHANES]    Frequency of Communication with Friends and Family: More than three times a week    Frequency of Social Gatherings with Friends and Family: Once a week    Attends Religious  Services: Patient declined    Active Member of Clubs or Organizations: No    Attends Engineer, structural: Not on file    Marital Status: Never married  Intimate Partner Violence: Not on file    Outpatient Medications Prior to Visit  Medication Sig Dispense Refill   Multiple Vitamin (MULTIVITAMIN) tablet Take 1 tablet by mouth daily. (Patient not taking: Reported on 09/21/2023)     oseltamivir (TAMIFLU) 75 MG capsule Take 75 mg by mouth 2 (two) times daily. (Patient not taking: Reported on 09/21/2023)     promethazine-dextromethorphan (PROMETHAZINE-DM) 6.25-15 MG/5ML syrup Take 5 mLs by mouth 4 (four) times daily as needed. (Patient not taking: Reported on  09/21/2023)     No facility-administered medications prior to visit.    No Known Allergies  ROS Review of Systems  Constitutional:  Negative for appetite change, chills, fatigue and fever.  HENT:  Negative for congestion, postnasal drip, rhinorrhea and sneezing.   Respiratory:  Negative for cough, shortness of breath and wheezing.   Cardiovascular:  Negative for chest pain, palpitations and leg swelling.  Gastrointestinal:  Negative for abdominal pain, constipation, nausea and vomiting.  Genitourinary:  Negative for difficulty urinating, dysuria, flank pain and frequency.  Musculoskeletal:  Negative for arthralgias, back pain, joint swelling and myalgias.  Skin:  Negative for color change, pallor, rash and wound.  Neurological:  Negative for dizziness, facial asymmetry, weakness, numbness and headaches.  Psychiatric/Behavioral:  Negative for behavioral problems, confusion, self-injury and suicidal ideas. The patient is nervous/anxious.       Objective:    Physical Exam Vitals and nursing note reviewed.  Constitutional:      General: She is not in acute distress.    Appearance: Normal appearance. She is not ill-appearing, toxic-appearing or diaphoretic.  Eyes:     General: No scleral icterus.       Right eye: No discharge.        Left eye: No discharge.     Extraocular Movements: Extraocular movements intact.     Conjunctiva/sclera: Conjunctivae normal.  Cardiovascular:     Rate and Rhythm: Normal rate and regular rhythm.     Pulses: Normal pulses.     Heart sounds: Normal heart sounds. No murmur heard.    No friction rub. No gallop.  Pulmonary:     Effort: Pulmonary effort is normal. No respiratory distress.     Breath sounds: Normal breath sounds. No stridor. No wheezing, rhonchi or rales.  Chest:     Chest wall: No tenderness.  Abdominal:     General: There is no distension.     Palpations: Abdomen is soft.     Tenderness: There is no abdominal tenderness. There is no  right CVA tenderness, left CVA tenderness or guarding.  Musculoskeletal:        General: No swelling, tenderness, deformity or signs of injury.     Right lower leg: No edema.     Left lower leg: No edema.  Skin:    General: Skin is warm and dry.     Capillary Refill: Capillary refill takes less than 2 seconds.     Coloration: Skin is not jaundiced or pale.     Findings: No bruising, erythema or lesion.  Neurological:     Mental Status: She is alert and oriented to person, place, and time.     Motor: No weakness.     Coordination: Coordination normal.     Gait: Gait normal.  Psychiatric:  Mood and Affect: Mood normal.        Behavior: Behavior normal.        Thought Content: Thought content normal.        Judgment: Judgment normal.     BP 114/76   Pulse 60   Temp 97.7 F (36.5 C)   Wt 119 lb (54 kg)   LMP 08/18/2023 (Exact Date)   SpO2 100%   BMI 23.24 kg/m  Wt Readings from Last 3 Encounters:  09/21/23 119 lb (54 kg)  09/05/23 118 lb (53.5 kg)  07/16/23 120 lb (54.4 kg)    No results found for: "TSH" Lab Results  Component Value Date   WBC 11.6 (H) 12/23/2019   HGB 9.7 (L) 12/23/2019   HCT 30.0 (L) 12/23/2019   MCV 92.6 12/23/2019   PLT 123 (L) 12/23/2019   Lab Results  Component Value Date   NA 136 11/10/2019   K 4.1 11/10/2019   CO2 21 (L) 11/10/2019   GLUCOSE 88 11/10/2019   BUN <5 (L) 11/10/2019   CREATININE 0.43 (L) 11/10/2019   BILITOT 0.4 11/10/2019   ALKPHOS 62 11/10/2019   AST 32 11/10/2019   ALT 16 11/10/2019   PROT 5.8 (L) 11/10/2019   ALBUMIN 2.9 (L) 11/10/2019   CALCIUM 9.1 11/10/2019   ANIONGAP 9 11/10/2019   No results found for: "CHOL" No results found for: "HDL" No results found for: "LDLCALC" No results found for: "TRIG" No results found for: "CHOLHDL" No results found for: "HGBA1C"    Assessment & Plan:   Problem List Items Addressed This Visit       Other   Anxiety and depression - Primary      09/21/2023   10:29  AM 07/16/2023    3:58 PM 12/13/2022    3:42 PM 12/13/2022    3:41 PM  GAD 7 : Generalized Anxiety Score  Nervous, Anxious, on Edge 1 0 3 3  Control/stop worrying 1 0 3 3  Worry too much - different things 1 0 3 3  Trouble relaxing 1 0 3 3  Restless 0 0 0 0  Easily annoyed or irritable 2 0 3 3  Afraid - awful might happen 0 0 0 0  Total GAD 7 Score 6 0 15 15  Anxiety Difficulty Somewhat difficult Not difficult at all Very difficult       09/21/2023   10:25 AM 07/16/2023    3:57 PM 12/13/2022    3:40 PM  Depression screen PHQ 2/9  Decreased Interest 2 0 2  Down, Depressed, Hopeless 3 0 2  PHQ - 2 Score 5 0 4  Altered sleeping 3 0 3  Tired, decreased energy 1 0 3  Change in appetite 0 0 2  Feeling bad or failure about yourself  0 0 0  Trouble concentrating 3 0 3  Moving slowly or fidgety/restless 1 0 3  Suicidal thoughts 0 0 0  PHQ-9 Score 13 0 18  Difficult doing work/chores Very difficult Not difficult at all Very difficult  Start hydroxyzine 25 mg 3 times daily as needed for anxiety, side effects of drowsiness with hydroxyzine discussed.  She is focused on treating anxiety, thinks that this will also help her depression Currently denies SI, HI Follow-up in 3 months        Relevant Medications   hydrOXYzine (VISTARIL) 25 MG capsule    Meds ordered this encounter  Medications   hydrOXYzine (VISTARIL) 25 MG capsule    Sig: Take 1  capsule (25 mg total) by mouth every 8 (eight) hours as needed.    Dispense:  30 capsule    Refill:  2    Follow-up: Return in about 3 months (around 12/22/2023) for ANXIETY.    Jaiana Sheffer R Remus Hagedorn, FNP

## 2023-09-21 NOTE — Assessment & Plan Note (Addendum)
    09/21/2023   10:29 AM 07/16/2023    3:58 PM 12/13/2022    3:42 PM 12/13/2022    3:41 PM  GAD 7 : Generalized Anxiety Score  Nervous, Anxious, on Edge 1 0 3 3  Control/stop worrying 1 0 3 3  Worry too much - different things 1 0 3 3  Trouble relaxing 1 0 3 3  Restless 0 0 0 0  Easily annoyed or irritable 2 0 3 3  Afraid - awful might happen 0 0 0 0  Total GAD 7 Score 6 0 15 15  Anxiety Difficulty Somewhat difficult Not difficult at all Very difficult       09/21/2023   10:25 AM 07/16/2023    3:57 PM 12/13/2022    3:40 PM  Depression screen PHQ 2/9  Decreased Interest 2 0 2  Down, Depressed, Hopeless 3 0 2  PHQ - 2 Score 5 0 4  Altered sleeping 3 0 3  Tired, decreased energy 1 0 3  Change in appetite 0 0 2  Feeling bad or failure about yourself  0 0 0  Trouble concentrating 3 0 3  Moving slowly or fidgety/restless 1 0 3  Suicidal thoughts 0 0 0  PHQ-9 Score 13 0 18  Difficult doing work/chores Very difficult Not difficult at all Very difficult  Start hydroxyzine 25 mg 3 times daily as needed for anxiety, side effects of drowsiness with hydroxyzine discussed.  She is focused on treating anxiety, thinks that this will also help her depression Currently denies SI, HI Follow-up in 3 months

## 2023-09-24 DIAGNOSIS — Z1329 Encounter for screening for other suspected endocrine disorder: Secondary | ICD-10-CM | POA: Diagnosis not present

## 2023-09-24 DIAGNOSIS — Z3009 Encounter for other general counseling and advice on contraception: Secondary | ICD-10-CM | POA: Diagnosis not present

## 2023-09-24 DIAGNOSIS — Z0000 Encounter for general adult medical examination without abnormal findings: Secondary | ICD-10-CM | POA: Diagnosis not present

## 2023-09-24 DIAGNOSIS — Z13220 Encounter for screening for lipoid disorders: Secondary | ICD-10-CM | POA: Diagnosis not present

## 2023-09-24 DIAGNOSIS — Z113 Encounter for screening for infections with a predominantly sexual mode of transmission: Secondary | ICD-10-CM | POA: Diagnosis not present

## 2023-09-24 DIAGNOSIS — Z124 Encounter for screening for malignant neoplasm of cervix: Secondary | ICD-10-CM | POA: Diagnosis not present

## 2023-09-24 DIAGNOSIS — Z130 Encounter for screening for diseases of the blood and blood-forming organs and certain disorders involving the immune mechanism: Secondary | ICD-10-CM | POA: Diagnosis not present

## 2023-10-01 DIAGNOSIS — Z419 Encounter for procedure for purposes other than remedying health state, unspecified: Secondary | ICD-10-CM | POA: Diagnosis not present

## 2023-11-01 DIAGNOSIS — Z419 Encounter for procedure for purposes other than remedying health state, unspecified: Secondary | ICD-10-CM | POA: Diagnosis not present

## 2023-11-17 ENCOUNTER — Ambulatory Visit
Admission: EM | Admit: 2023-11-17 | Discharge: 2023-11-17 | Disposition: A | Discharge status: Home / Self Care | Attending: Family Medicine | Admitting: Family Medicine

## 2023-11-17 DIAGNOSIS — Z113 Encounter for screening for infections with a predominantly sexual mode of transmission: Secondary | ICD-10-CM | POA: Insufficient documentation

## 2023-11-17 NOTE — ED Provider Notes (Signed)
 [facility]    CSN: [id number] Arrival date & time: 11/17/23  1012      History   Chief Complaint No chief complaint on file.   HPI Cheyenne Medina is a 23 y.o. female presents for STD screening.  Patient currently denies any symptoms including dysuria, fevers, nausea/vomiting, flank pain.  Did have 1 day of more vaginal discharge than normal but states it was just 1 day and it was not smelly or pruritic.  No known STD exposure.  No other concerns at this time. HPI  Past Medical History:  Diagnosis Date   Anemia of pregnancy 12/13/2022   Hgb 9.7 6/4, B12 low, Rx B12 1000mcg QD and recommend PO iron; for iron infusion 07-20.     Anxiety and depression 12/13/2022   Benign gestational thrombocytopenia (HCC) 09/05/2023   142 in MAU; 130 in MAU on 07-12. Repeat at 36 weeks. 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 Authorizing Provider  hydrOXYzine  (VISTARIL ) 25 MG capsule Take 1 capsule (25 mg total) by mouth every 8 (eight) hours as  needed. 09/21/23   Paseda, Folashade R, FNP  Multiple Vitamin (MULTIVITAMIN) tablet Take 1 tablet by mouth daily. Patient not taking: Reported on 09/21/2023    [provider]    Family History History reviewed. No pertinent family history.  Social History Social History   Tobacco Use   Smoking status: Never    Passive exposure: Never   Smokeless tobacco: Never  Vaping Use   Vaping status: Never Used  Substance Use Topics   Alcohol use: Yes    Comment: occasionally   Drug use: Never     Allergies   Patient has no known allergies.   Review of Systems Review of Systems  Genitourinary:        Std screening     Physical Exam Triage Vital Signs ED Triage Vitals  Encounter Vitals Group     BP 11/17/23 1039 122/79     Girls Systolic BP Percentile --      Girls Diastolic BP Percentile --      Boys Systolic BP Percentile --      Boys Diastolic BP Percentile --      Pulse Rate 11/17/23 1039 84  Resp 11/17/23 1039 18     Temp 11/17/23 1039 99.2 F (37.3 C)     Temp Source 11/17/23 1039 Oral     SpO2 11/17/23 1039 96 %     Weight --      Height --      Head Circumference --      Peak Flow --      Pain Score 11/17/23 1034 0     Pain Loc --      Pain Education --      Exclude from Growth Chart --    No data found.  Updated Vital Signs BP 122/79 (BP Location: Right Arm)   Pulse 84   Temp 99.2 F (37.3 C) (Oral)   Resp 18   LMP 11/01/2023   SpO2 96%   Visual Acuity Right Eye Distance:   Left Eye Distance:   Bilateral Distance:    Right Eye Near:   Left Eye Near:    Bilateral Near:     Physical Exam Vitals and nursing note reviewed.  Constitutional:      Appearance: Normal appearance.  HENT:     Head: Normocephalic and atraumatic.   Eyes:     Pupils: Pupils are equal, round, and reactive to light.    Cardiovascular:     Rate and Rhythm: Normal rate.  Pulmonary:     Effort: Pulmonary effort is normal.   Skin:    General: Skin is  warm and dry.   Neurological:     General: No focal deficit present.     Mental Status: She is alert and oriented to person, place, and time.   Psychiatric:        Mood and Affect: Mood normal.        Behavior: Behavior normal.      UC Treatments / Results  Labs (all labs ordered are listed, but only abnormal results are displayed) Labs Reviewed  RPR  HIV ANTIBODY (ROUTINE TESTING W REFLEX)  CERVICOVAGINAL ANCILLARY ONLY    EKG   Radiology No results found.  Procedures Procedures (including critical care time)  Medications Ordered in UC Medications - No data to display  Initial Impression / Assessment and Plan / UC Course  I have reviewed the triage vital signs and the nursing notes.  Pertinent labs & imaging results that were available during my care of the patient were reviewed by me and considered in my medical decision making (see chart for details).     STD testing as ordered and will contact for any positive results.  Patient to follow-up as needed. Final Clinical Impressions(s) / UC Diagnoses   Final diagnoses:  Screening examination for STD (sexually transmitted disease)     Discharge Instructions      The clinic will contact you with results of the testing done today if positive.  Follow-up with your PCP or gynecologist as needed.    ED Prescriptions   None    PDMP not reviewed this encounter.   [name], NP 11/17/23 1052

## 2023-11-17 NOTE — Discharge Instructions (Addendum)
 The clinic will contact you with results of the testing done today if positive.  Follow-up with your PCP or gynecologist as needed.

## 2023-11-17 NOTE — ED Triage Notes (Signed)
 Pt reports she wants to get checked for STDs. Reports slight discharge  Unprotected intercourse x 1 month ago

## 2023-11-18 LAB — HIV ANTIBODY (ROUTINE TESTING W REFLEX): HIV Screen 4th Generation wRfx: NONREACTIVE

## 2023-11-18 LAB — RPR: RPR Ser Ql: NONREACTIVE

## 2023-11-20 LAB — CERVICOVAGINAL ANCILLARY ONLY
Bacterial Vaginitis (gardnerella): NEGATIVE
Candida Glabrata: NEGATIVE
Candida Vaginitis: NEGATIVE
Chlamydia: NEGATIVE
Comment: NEGATIVE
Comment: NEGATIVE
Comment: NEGATIVE
Comment: NEGATIVE
Comment: NEGATIVE
Comment: NORMAL
Neisseria Gonorrhea: NEGATIVE
Trichomonas: NEGATIVE

## 2023-12-01 DIAGNOSIS — Z419 Encounter for procedure for purposes other than remedying health state, unspecified: Secondary | ICD-10-CM | POA: Diagnosis not present

## 2023-12-24 ENCOUNTER — Ambulatory Visit: Admitting: Nurse Practitioner

## 2024-01-01 DIAGNOSIS — Z419 Encounter for procedure for purposes other than remedying health state, unspecified: Secondary | ICD-10-CM | POA: Diagnosis not present

## 2024-01-05 ENCOUNTER — Ambulatory Visit
Admission: EM | Admit: 2024-01-05 | Discharge: 2024-01-05 | Disposition: A | Discharge status: Home / Self Care | Attending: Family Medicine | Admitting: Family Medicine

## 2024-01-05 DIAGNOSIS — J209 Acute bronchitis, unspecified: Secondary | ICD-10-CM | POA: Insufficient documentation

## 2024-01-05 DIAGNOSIS — N760 Acute vaginitis: Secondary | ICD-10-CM | POA: Diagnosis not present

## 2024-01-05 DIAGNOSIS — B9689 Other specified bacterial agents as the cause of diseases classified elsewhere: Secondary | ICD-10-CM | POA: Diagnosis not present

## 2024-01-05 MED ORDER — METRONIDAZOLE 500 MG PO TABS: 500.0000 mg | ORAL_TABLET | Freq: Two times a day (BID) | ORAL | 0 refills | Status: DC

## 2024-01-05 MED ORDER — AZITHROMYCIN 250 MG PO TABS: ORAL_TABLET | ORAL | 0 refills | Status: DC

## 2024-01-05 MED ORDER — PROMETHAZINE-DM 6.25-15 MG/5ML PO SYRP: 5.0000 mL | ORAL_SOLUTION | Freq: Three times a day (TID) | ORAL | 0 refills | Status: DC | PRN

## 2024-01-05 MED ORDER — FLUCONAZOLE 150 MG PO TABS: 150.0000 mg | ORAL_TABLET | ORAL | 0 refills | Status: DC

## 2024-01-05 NOTE — ED Triage Notes (Signed)
 Pt c/o vaginal d/c x 2 weeks-used boric acid with improvement then a return-pt also c/o cough x 1.5 weeks-NAD-steady gait

## 2024-01-05 NOTE — ED Provider Notes (Signed)
 [facility]  Note:  This document was prepared using Conservation officer, historic buildings and may include unintentional dictation errors.  MRN: [id number] DOB: 2000-09-02  Subjective:   Cheyenne Medina is a 23 y.o. female presenting for 2 chief complaints.  Reports 1.5 week history of persistent dry but now productive cough. No fever, sinus symptoms, chest pain, shob, wheezing, rashes. No asthma. No smoking of any kind including cigarettes, cigars, vaping, marijuana use.   Reports 2 week history of vaginal discharge, has had intermittent strong odor. Used boric acid suppositories, had improvement initially but symptoms returned. Denies fever, n/v, abdominal pain, pelvic pain, rashes, dysuria, urinary frequency, hematuria.  Has had a few BV infections, yeast infections. No vaginal itching.   No current facility-administered medications for this encounter.  Current Outpatient Medications:    hydrOXYzine  (VISTARIL ) 25 MG capsule, Take 1 capsule (25 mg total) by mouth every 8 (eight) hours as needed., Disp: 30 capsule, Rfl: 2   Multiple Vitamin (MULTIVITAMIN) tablet, Take 1 tablet by mouth daily. (Patient not taking: Reported on 09/21/2023), Disp: , Rfl:    No Known Allergies  Past Medical History:  Diagnosis Date   Anemia of pregnancy 12/13/2022   Hgb 9.7 6/4, B12 low, Rx B12 1000mcg QD and recommend PO iron; for iron infusion 07-20.     Anxiety and depression 12/13/2022   Benign gestational thrombocytopenia (HCC) 09/05/2023   142 in MAU; 130 in MAU on 07-12. Repeat at 36 weeks. /     Chlamydial infection 09/05/2023   tx 1g Azith 11/13/2018, TOC 43mo       Past Surgical History:  Procedure Laterality Date   DILATION AND CURETTAGE OF UTERUS      No family history on file.  Social History   Tobacco Use   Smoking status: Never    Passive exposure: Never   Smokeless tobacco: Never  Vaping Use   Vaping status: Never Used  Substance Use Topics   Alcohol  use: Yes    Comment: occasionally   Drug use: Never    ROS   Objective:   Vitals: BP 130/89 (BP Location: Right Arm)   Pulse 87   Temp 98.9 F (37.2 C) (Oral)   Resp 16   LMP 12/20/2023   SpO2 98%   Physical Exam Constitutional:      General: She is not in acute distress.    Appearance: Normal appearance. She is well-developed and normal weight. She is not ill-appearing, toxic-appearing or diaphoretic.  HENT:     Head: Normocephalic and atraumatic.     Right Ear: Tympanic membrane, ear canal and external ear normal. No drainage or tenderness. No middle ear effusion. There is no impacted cerumen. Tympanic membrane is not erythematous or bulging.     Left Ear: Tympanic membrane, ear canal and external ear normal. No drainage or tenderness.  No middle ear effusion. There is no impacted cerumen. Tympanic membrane is not erythematous or bulging.     Nose: Nose normal. No congestion or rhinorrhea.     Mouth/Throat:     Mouth: Mucous membranes are moist. No oral lesions.     Pharynx: No pharyngeal swelling, oropharyngeal exudate, posterior oropharyngeal erythema or uvula swelling.     Tonsils: No tonsillar exudate or tonsillar abscesses.  Eyes:     General: No scleral icterus.       Right eye: No discharge.        Left eye: No discharge.     Extraocular  Movements: Extraocular movements intact.     Right eye: Normal extraocular motion.     Left eye: Normal extraocular motion.     Conjunctiva/sclera: Conjunctivae normal.  Cardiovascular:     Rate and Rhythm: Normal rate and regular rhythm.     Heart sounds: Normal heart sounds. No murmur heard.    No friction rub. No gallop.  Pulmonary:     Effort: Pulmonary effort is normal. No respiratory distress.     Breath sounds: No stridor. Rhonchi (mid-lower lung fields bilaterally) present. No wheezing or rales.  Chest:     Chest wall: No tenderness.  Abdominal:     General: Bowel sounds are normal. There is no distension.      Palpations: Abdomen is soft. There is no mass.     Tenderness: There is no abdominal tenderness. There is no right CVA tenderness, left CVA tenderness, guarding or rebound.  Musculoskeletal:     Cervical back: Normal range of motion and neck supple.  Lymphadenopathy:     Cervical: No cervical adenopathy.  Skin:    General: Skin is warm and dry.  Neurological:     General: No focal deficit present.     Mental Status: She is alert and oriented to person, place, and time.  Psychiatric:        Mood and Affect: Mood normal.        Behavior: Behavior normal.        Thought Content: Thought content normal.        Judgment: Judgment normal.     Assessment and Plan :   PDMP not reviewed this encounter.  1. Bacterial vaginosis   2. Acute bronchitis, unspecified organism    Will manage acute bronchitis with azithromycin , supportive care.  Patient requested empiric treatment for suspected bacterial vaginosis.  I was agreeable to do this with metronidazole .  We deferred HIV testing as she has had it twice in the past 6 months.  Patient also deferred RPR testing.  Vaginal cytology pending. Counseled patient on potential for adverse effects with medications prescribed/recommended today, ER and return-to-clinic precautions discussed, patient verbalized understanding.    Christopher Savannah, [place] 01/05/24 [phone]

## 2024-01-07 ENCOUNTER — Ambulatory Visit (HOSPITAL_COMMUNITY): Payer: Self-pay

## 2024-01-07 LAB — CERVICOVAGINAL ANCILLARY ONLY
Bacterial Vaginitis (gardnerella): POSITIVE — AB
Candida Glabrata: NEGATIVE
Candida Vaginitis: NEGATIVE
Chlamydia: NEGATIVE
Comment: NEGATIVE
Comment: NEGATIVE
Comment: NEGATIVE
Comment: NEGATIVE
Comment: NEGATIVE
Comment: NORMAL
Neisseria Gonorrhea: NEGATIVE
Trichomonas: NEGATIVE

## 2024-01-14 ENCOUNTER — Ambulatory Visit (INDEPENDENT_AMBULATORY_CARE_PROVIDER_SITE_OTHER)

## 2024-01-14 ENCOUNTER — Ambulatory Visit
Admission: EM | Admit: 2024-01-14 | Discharge: 2024-01-14 | Disposition: A | Discharge status: Home / Self Care | Attending: Emergency Medicine | Admitting: Emergency Medicine

## 2024-01-14 ENCOUNTER — Ambulatory Visit: Payer: Self-pay

## 2024-01-14 DIAGNOSIS — R772 Abnormality of alphafetoprotein: Secondary | ICD-10-CM | POA: Insufficient documentation

## 2024-01-14 DIAGNOSIS — R059 Cough, unspecified: Secondary | ICD-10-CM | POA: Diagnosis not present

## 2024-01-14 DIAGNOSIS — J209 Acute bronchitis, unspecified: Secondary | ICD-10-CM

## 2024-01-14 DIAGNOSIS — R918 Other nonspecific abnormal finding of lung field: Secondary | ICD-10-CM | POA: Diagnosis not present

## 2024-01-14 MED ORDER — PREDNISONE 10 MG (21) PO TBPK: ORAL_TABLET | ORAL | 0 refills | Status: AC

## 2024-01-14 MED ORDER — ALBUTEROL SULFATE HFA 108 (90 BASE) MCG/ACT IN AERS: 1.0000 | INHALATION_SPRAY | Freq: Four times a day (QID) | RESPIRATORY_TRACT | 0 refills | Status: DC | PRN

## 2024-01-14 NOTE — ED Provider Notes (Signed)
 [facility]    CSN: [id number] Arrival date & time: 01/14/24  1309      History   Chief Complaint Chief Complaint  Patient presents with   Cough   Nasal Congestion    HPI Cheyenne Medina is a 23 y.o. female.  Patient to the urgent care today with concerns of cough and congestion.  Was seen several days/weeks ago for similar concerns and diagnosed with bronchitis at that time.  She has been taking her medications, her cough medication endorses continued coughing, chest congestion, and feelings of shortness of breath on occasion.  Denies any recent fevers.   Cough   Past Medical History:  Diagnosis Date   Anemia of pregnancy 12/13/2022   Hgb 9.7 6/4, B12 low, Rx B12 1000mcg QD and recommend PO iron; for iron infusion 07-20.     Anxiety and depression 12/13/2022   Benign gestational thrombocytopenia (HCC) 09/05/2023   142 in MAU; 130 in MAU on 07-12. Repeat at 36 weeks. 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   Benign gestational thrombocytopenia (HCC) 09/05/2023   Chlamydial infection 09/05/2023   Annual physical exam 07/16/2023   Anemia of pregnancy 12/13/2022   Anxiety and depression 12/13/2022   Fatigue 12/13/2022   Normal labor and delivery 12/22/2019   S/P dilation and curettage 01/21/2019   Fetal anencephaly complicating pregnancy 01/15/2019   Pregnancy 11/12/2018   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 Authorizing Provider  albuterol  (VENTOLIN  HFA) 108 (90 Base) MCG/ACT inhaler Inhale 1-2 puffs into the lungs every 6 (six) hours as needed for wheezing or shortness of breath. 01/14/24  Yes Mak Bonny, [place] [facility], PA-C  predniSONE  (STERAPRED UNI-PAK 21 TAB) 10 MG (21) TBPK tablet Take 6 tablets (60 mg total) by mouth daily for 1 day, THEN 5 tablets (50 mg total) daily for 1 day, THEN 4 tablets (40 mg total) daily for 1 day, THEN 3 tablets (30 mg total) daily for 1 day, THEN 2 tablets (20 mg total) daily for 1 day, THEN 1 tablet (10 mg total) daily for 1 day. 01/14/24 01/20/24 Yes Isiac Breighner A, PA-C  azithromycin  (ZITHROMAX ) 250 MG tablet Day 1: take 2 tablets. Day 2-5: Take 1 tablet daily. 01/05/24   Christopher Savannah, PA-C  fluconazole  (DIFLUCAN ) 150 MG tablet Take 1 tablet (150 mg total) by mouth every 3 (three) days. 01/05/24   Christopher Savannah, PA-C  hydrOXYzine  (VISTARIL ) 25 MG capsule Take 1 capsule (25 mg total) by mouth every 8 (eight) hours as needed. 09/21/23   Paseda, Folashade R, FNP  metroNIDAZOLE  (FLAGYL ) 500 MG tablet Take 1 tablet (  500 mg total) by mouth 2 (two) times daily. 01/05/24   Christopher Savannah, PA-C  Multiple Vitamin (MULTIVITAMIN) tablet Take 1 tablet by mouth daily. Patient not taking: Reported on 09/21/2023    [provider]  promethazine -dextromethorphan (PROMETHAZINE -DM) 6.25-15 MG/5ML syrup Take 5 mLs by mouth 3 (three) times daily as needed for cough. 01/05/24   Christopher Savannah, PA-C    Family History History reviewed. No pertinent family history.  Social History Social History   Tobacco Use   Smoking status: Never    Passive exposure: Never   Smokeless tobacco: Never  Vaping Use   Vaping status: Never Used  Substance Use Topics   Alcohol use: Yes    Comment: occasionally   Drug use: Never     Allergies   Patient has no known allergies.   Review of Systems Review of Systems  Respiratory:  Positive for cough.   All other systems reviewed and are  negative.    Physical Exam Triage Vital Signs ED Triage Vitals  Encounter Vitals Group     BP 01/14/24 1407 125/84     Girls Systolic BP Percentile --      Girls Diastolic BP Percentile --      Boys Systolic BP Percentile --      Boys Diastolic BP Percentile --      Pulse Rate 01/14/24 1407 85     Resp 01/14/24 1407 20     Temp 01/14/24 1407 99.4 F (37.4 C)     Temp Source 01/14/24 1407 Oral     SpO2 01/14/24 1407 96 %     Weight 01/14/24 1405 119 lb 0.8 oz (54 kg)     Height 01/14/24 1405 5' (1.524 m)     Head Circumference --      Peak Flow --      Pain Score 01/14/24 1403 0     Pain Loc --      Pain Education --      Exclude from Growth Chart --    No data found.  Updated Vital Signs BP 125/84 (BP Location: Left Arm)   Pulse 85   Temp 99.4 F (37.4 C) (Oral)   Resp 20   Ht 5' (1.524 m)   Wt 119 lb 0.8 oz (54 kg)   LMP 12/20/2023   SpO2 96%   BMI 23.25 kg/m   Visual Acuity Right Eye Distance:   Left Eye Distance:   Bilateral Distance:    Right Eye Near:   Left Eye Near:    Bilateral Near:     Physical Exam Vitals and nursing note reviewed.  Constitutional:      General: She is not in acute distress.    Appearance: She is well-developed.  HENT:     Head: Normocephalic and atraumatic.  Eyes:     Conjunctiva/sclera: Conjunctivae normal.  Cardiovascular:     Rate and Rhythm: Normal rate and regular rhythm.     Heart sounds: No murmur heard. Pulmonary:     Effort: Pulmonary effort is normal. No respiratory distress.     Breath sounds: Normal breath sounds. No wheezing, rhonchi or rales.  Abdominal:     Palpations: Abdomen is soft.     Tenderness: There is no abdominal tenderness.  Musculoskeletal:        General: No swelling.     Cervical back: Neck supple.  Skin:    General: Skin is warm and dry.     Capillary Refill: Capillary refill takes less than  2 seconds.  Neurological:     Mental Status: She is alert.  Psychiatric:        Mood and  Affect: Mood normal.      UC Treatments / Results  Labs (all labs ordered are listed, but only abnormal results are displayed) Labs Reviewed - No data to display  EKG   Radiology No results found.  Procedures Procedures (including critical care time)  Medications Ordered in UC Medications - No data to display  Initial Impression / Assessment and Plan / UC Course  I have reviewed the triage vital signs and the nursing notes.  Pertinent labs & imaging results that were available during my care of the patient were reviewed by me and considered in my medical decision making (see chart for details).  Patient presents to urgent care with concerns of cough and congestion.  Reports has been ongoing for the last 10 days or so and have not fully cleared since she was last seen in urgent care.  She has not taken all her medications except the promethazine  dextromethorphan combination medication.  Denies any fever, chills or bodyaches.  Exam reveals no abnormal heart or lung sounds.  No wheezing, rales, rhonchi.  No other signs of respiratory distress.  Will obtain chest x-ray for further evaluation given prolonged course of symptoms.  My personal potation, no obvious findings seen on chest x-ray.  Suspect likely ongoing bronchitis concerns.  Will start patient on course of a prednisone  burst for the next 6 days as well as an inhaler for her work of breathing.  Advised return precautions and ED precautions for concerns or worsening symptoms.  Discharged home in stable condition. Final Clinical Impressions(s) / UC Diagnoses   Final diagnoses:  Acute bronchitis, unspecified organism     Discharge Instructions      You are seen at the urgent care today for concerns of cough and congestion.  I suspect that you likely have ongoing issues with bronchitis that is not fully resolved.  I am starting on a course of steroids as well as an inhaler.  Please use this as prescribed.  For any concerns  of new or worsening symptoms, return to the urgent care or seek evaluation at the emergency department.  Otherwise, please follow-up with your primary care provider.     ED Prescriptions     Medication Sig Dispense Auth. Provider   predniSONE  (STERAPRED UNI-PAK 21 TAB) 10 MG (21) TBPK tablet Take 6 tablets (60 mg total) by mouth daily for 1 day, THEN 5 tablets (50 mg total) daily for 1 day, THEN 4 tablets (40 mg total) daily for 1 day, THEN 3 tablets (30 mg total) daily for 1 day, THEN 2 tablets (20 mg total) daily for 1 day, THEN 1 tablet (10 mg total) daily for 1 day. 21 tablet Taiya Nutting A, PA-C   albuterol  (VENTOLIN  HFA) 108 (90 Base) MCG/ACT inhaler Inhale 1-2 puffs into the lungs every 6 (six) hours as needed for wheezing or shortness of breath. 1 each Gabrielle Mester A, PA-C      PDMP not reviewed this encounter.   Ashonti Leandro A, PA-C 01/14/24 1452

## 2024-01-14 NOTE — ED Triage Notes (Signed)
 I have bronchitis (acute), I was seen in Urgent Care & ? Resolving, I have been coughing since 08-09 and feel like the cough is making my chest hurt. No fever.

## 2024-01-14 NOTE — Telephone Encounter (Signed)
             FYI Only or Action Required?: FYI only for provider.  Patient was last seen in primary care on 09/21/2023 by Paseda, Folashade R, FNP.  Called Nurse Triage reporting Cough.  Symptoms began several days ago.  Interventions attempted: Nothing.  Symptoms are: gradually worsening.  Triage Disposition: See PCP When Office is Open (Within 3 Days)  Patient/caregiver understands and will follow disposition?: YesCopied from CRM #[id number]. Topic: Clinical - Red Word Triage >> Jan 14, 2024 12:43 PM Larissa S wrote: [facility] that prompted transfer to Nurse Triage: chest tightness, cough w/ mucous- worsening Reason for Disposition  Cough has been present for > 3 weeks  Answer Assessment - Initial Assessment Questions 1. ONSET: When did the cough begin?      Started again on Sunday 2. SEVERITY: How bad is the cough today?      mild 3. SPUTUM: Describe the color of your sputum (e.g., none, dry cough; clear, white, yellow, green)     Was seen in UC and dx with bronchitis; green 4. HEMOPTYSIS: Are you coughing up any blood? If Yes, ask: How much? (e.g., flecks, streaks, tablespoons, etc.)     denies 5. DIFFICULTY BREATHING: Are you having difficulty breathing? If Yes, ask: How bad is it? (e.g., mild, moderate, severe)      mild 6. FEVER: Do you have a fever? If Yes, ask: What is your temperature, how was it measured, and when did it start?     no 7. CARDIAC HISTORY: Do you have any history of heart disease? (e.g., heart attack, congestive heart failure)      no 8. LUNG HISTORY: Do you have any history of lung disease?  (e.g., pulmonary embolus, asthma, emphysema)     no 9. PE RISK FACTORS: Do you have a history of blood clots? (or: recent major surgery, recent prolonged travel, bedridden)     no 10. OTHER SYMPTOMS: Do you have any other symptoms? (e.g., runny nose, wheezing, chest pain)       Runny nose 11. PREGNANCY: Is there any chance you are  pregnant? When was your last menstrual period?       na 12. TRAVEL: Have you traveled out of the country in the last month? (e.g., travel history, exposures)       no  Protocols used: Cough - Acute Productive-A-AH

## 2024-01-14 NOTE — Discharge Instructions (Signed)
 You are seen at the urgent care today for concerns of cough and congestion.  I suspect that you likely have ongoing issues with bronchitis that is not fully resolved.  I am starting on a course of steroids as well as an inhaler.  Please use this as prescribed.  For any concerns of new or worsening symptoms, return to the urgent care or seek evaluation at the emergency department.  Otherwise, please follow-up with your primary care provider.

## 2024-01-29 ENCOUNTER — Ambulatory Visit (INDEPENDENT_AMBULATORY_CARE_PROVIDER_SITE_OTHER): Admitting: Nurse Practitioner

## 2024-01-29 VITALS — BP 113/75 | HR 79 | Temp 97.5°F | Wt 121.0 lb

## 2024-01-29 DIAGNOSIS — Z113 Encounter for screening for infections with a predominantly sexual mode of transmission: Secondary | ICD-10-CM | POA: Diagnosis not present

## 2024-01-29 DIAGNOSIS — F32A Depression, unspecified: Secondary | ICD-10-CM

## 2024-01-29 DIAGNOSIS — J209 Acute bronchitis, unspecified: Secondary | ICD-10-CM | POA: Diagnosis not present

## 2024-01-29 DIAGNOSIS — F419 Anxiety disorder, unspecified: Secondary | ICD-10-CM

## 2024-01-29 NOTE — Progress Notes (Signed)
 Established Patient Office Visit  Subjective:  Patient ID: Cheyenne Medina, female    DOB: 2000/11/18  Age: 23 y.o. MRN: [id number]  CC:  Chief Complaint  Patient presents with   Anxiety    HPI Cheyenne Medina is a 23 y.o. female    Discussed the use of AI scribe software for clinical note transcription with the patient, who gave verbal consent to proceed.  History of Present Illness Cheyenne Medina is a 23 year old female who presents for follow-up for anxiety.  She has been managing her anxiety with hydroxyzine  as needed, using it only two or three times, indicating minimal need. She feels she has been doing okay overall.   She recently experienced an episode of bronchitis and was treated with azithromycin  and prednisone , although she did not complete the prednisone  course. She continues to have a slight dry cough with minimal sputum production and uses an albuterol  inhaler as needed. She feels short of breath, attributing it to being out of shape or possibly anxiety-related. No history of asthma.  She is concerned about oral thrush, which she believes developed after taking antibiotics for bronchitis and bacterial vaginosis. She was prescribed metronidazole  for BV and fluconazole  for a potential yeast infection, taking fluconazole  three days in a row last week. She reports concern about her tongue appearing white but does not report pain or trouble swallowing. She is considering testing for HIV and other STDs for peace of mind.  No vaginal discharge or other symptoms related to bacterial vaginosis. She expresses nervousness about her health, attributing it to anxiety.  She does not smoke or vape.  Assessment & Plan    Past Medical History:  Diagnosis Date   Anemia of pregnancy 12/13/2022   Hgb 9.7 6/4, B12 low, Rx B12 1000mcg QD and recommend PO iron; for iron infusion 07-20.     Anxiety and depression 12/13/2022   Benign gestational thrombocytopenia (HCC)  09/05/2023   142 in MAU; 130 in MAU on 07-12. Repeat at 36 weeks. /     Chlamydial infection 09/05/2023   tx 1g Azith 11/13/2018, TOC 14mo      Past Surgical History:  Procedure Laterality Date   DILATION AND CURETTAGE OF UTERUS      History reviewed. No pertinent family history.  Social History   Socioeconomic History   Marital status: Single    Spouse name: Not on file   Number of children: 1   Years of education: Not on file   Highest education level: Some college, no degree  Occupational History   Not on file  Tobacco Use   Smoking status: Never    Passive exposure: Never   Smokeless tobacco: Never  Vaping Use   Vaping status: Never Used  Substance and Sexual Activity   Alcohol use: Yes    Comment: occasionally   Drug use: Never   Sexual activity: Yes    Birth control/protection: None  Other Topics Concern   Not on file  Social History Narrative   Lives home alone with her child   Social Drivers of Health   Financial Resource Strain: Low Risk  (01/29/2024)   Overall Financial Resource Strain (CARDIA)    Difficulty of Paying Living Expenses: Not hard at all  Food Insecurity: No Food Insecurity (01/29/2024)   Hunger Vital Sign    Worried About Running Out of Food in the Last Year: Never true    Ran Out of Food in the Last Year: Never true  Transportation Needs: No Transportation Needs (01/29/2024)   PRAPARE - Administrator, Civil Service (Medical): No    Lack of Transportation (Non-Medical): No  Physical Activity: Insufficiently Active (01/29/2024)   Exercise Vital Sign    Days of Exercise per Week: 1 day    Minutes of Exercise per Session: 10 min  Stress: No Stress Concern Present (01/29/2024)   [employer] of Occupational Health - Occupational Stress Questionnaire    Feeling of Stress: Only a little  Social Connections: Socially Isolated (01/29/2024)   Social Connection and Isolation Panel    Frequency of Communication with Friends and Family:  More than three times a week    Frequency of Social Gatherings with Friends and Family: Once a week    Attends Religious Services: Patient declined    Database administrator or Organizations: No    Attends Engineer, structural: Not on file    Marital Status: Never married  Intimate Partner Violence: Not on file    Outpatient Medications Prior to Visit  Medication Sig Dispense Refill   albuterol  (VENTOLIN  HFA) 108 (90 Base) MCG/ACT inhaler Inhale 1-2 puffs into the lungs every 6 (six) hours as needed for wheezing or shortness of breath. 1 each 0   hydrOXYzine  (VISTARIL ) 25 MG capsule Take 1 capsule (25 mg total) by mouth every 8 (eight) hours as needed. 30 capsule 2   Multiple Vitamin (MULTIVITAMIN) tablet Take 1 tablet by mouth daily. (Patient not taking: Reported on 01/29/2024)     promethazine -dextromethorphan (PROMETHAZINE -DM) 6.25-15 MG/5ML syrup Take 5 mLs by mouth 3 (three) times daily as needed for cough. (Patient not taking: Reported on 01/29/2024) 200 mL 0   azithromycin  (ZITHROMAX ) 250 MG tablet Day 1: take 2 tablets. Day 2-5: Take 1 tablet daily. (Patient not taking: Reported on 01/29/2024) 6 tablet 0   fluconazole  (DIFLUCAN ) 150 MG tablet Take 1 tablet (150 mg total) by mouth every 3 (three) days. (Patient not taking: Reported on 01/29/2024) 3 tablet 0   metroNIDAZOLE  (FLAGYL ) 500 MG tablet Take 1 tablet (500 mg total) by mouth 2 (two) times daily. (Patient not taking: Reported on 01/29/2024) 14 tablet 0   No facility-administered medications prior to visit.    No Known Allergies  ROS Review of Systems  Constitutional:  Negative for appetite change, chills, fatigue and fever.  HENT:  Negative for congestion, postnasal drip, rhinorrhea and sneezing.   Respiratory:  Positive for cough and shortness of breath. Negative for wheezing.   Cardiovascular:  Negative for chest pain, palpitations and leg swelling.  Gastrointestinal:  Negative for abdominal pain, constipation, nausea and  vomiting.  Genitourinary:  Negative for difficulty urinating, dysuria, flank pain and frequency.  Musculoskeletal:  Negative for arthralgias, back pain, joint swelling and myalgias.  Skin:  Negative for color change, pallor, rash and wound.  Neurological:  Negative for dizziness, facial asymmetry, weakness, numbness and headaches.  Psychiatric/Behavioral:  Negative for behavioral problems, confusion, self-injury and suicidal ideas.       Objective:    Physical Exam Vitals and nursing note reviewed.  Constitutional:      General: She is not in acute distress.    Appearance: Normal appearance. She is not ill-appearing, toxic-appearing or diaphoretic.  HENT:     Head: No right periorbital erythema.     Mouth/Throat:     Mouth: Mucous membranes are moist.     Tongue: No lesions. Tongue does not deviate from midline.     Palate: No mass and  lesions.     Pharynx: Oropharynx is clear. No oropharyngeal exudate or posterior oropharyngeal erythema.     Tonsils: No tonsillar exudate or tonsillar abscesses. 0 on the right. 0 on the left.  Eyes:     General: No scleral icterus.       Right eye: No discharge.        Left eye: No discharge.     Extraocular Movements: Extraocular movements intact.     Conjunctiva/sclera: Conjunctivae normal.  Cardiovascular:     Rate and Rhythm: Normal rate and regular rhythm.     Pulses: Normal pulses.     Heart sounds: Normal heart sounds. No murmur heard.    No friction rub. No gallop.  Pulmonary:     Effort: Pulmonary effort is normal. No respiratory distress.     Breath sounds: Normal breath sounds. No stridor. No wheezing, rhonchi or rales.  Chest:     Chest wall: No tenderness.  Abdominal:     General: There is no distension.     Palpations: Abdomen is soft.     Tenderness: There is no abdominal tenderness. There is no right CVA tenderness, left CVA tenderness or guarding.  Musculoskeletal:        General: No swelling, tenderness, deformity or  signs of injury.     Right lower leg: No edema.     Left lower leg: No edema.  Skin:    General: Skin is warm and dry.     Capillary Refill: Capillary refill takes less than 2 seconds.     Coloration: Skin is not jaundiced or pale.     Findings: No bruising, erythema or lesion.  Neurological:     Mental Status: She is alert and oriented to person, place, and time.     Motor: No weakness.     Gait: Gait normal.  Psychiatric:        Mood and Affect: Mood normal.        Behavior: Behavior normal.        Thought Content: Thought content normal.        Judgment: Judgment normal.     BP 113/75   Pulse 79   Temp (!) 97.5 F (36.4 C)   Wt 121 lb (54.9 kg)   LMP 12/20/2023   SpO2 100%   BMI 23.63 kg/m  Wt Readings from Last 3 Encounters:  01/29/24 121 lb (54.9 kg)  01/14/24 119 lb 0.8 oz (54 kg)  09/21/23 119 lb (54 kg)    No results found for: TSH Lab Results  Component Value Date   WBC 11.6 (H) 12/23/2019   HGB 9.7 (L) 12/23/2019   HCT 30.0 (L) 12/23/2019   MCV 92.6 12/23/2019   PLT 123 (L) 12/23/2019   Lab Results  Component Value Date   NA 136 11/10/2019   K 4.1 11/10/2019   CO2 21 (L) 11/10/2019   GLUCOSE 88 11/10/2019   BUN <5 (L) 11/10/2019   CREATININE 0.43 (L) 11/10/2019   BILITOT 0.4 11/10/2019   ALKPHOS 62 11/10/2019   AST 32 11/10/2019   ALT 16 11/10/2019   PROT 5.8 (L) 11/10/2019   ALBUMIN 2.9 (L) 11/10/2019   CALCIUM 9.1 11/10/2019   ANIONGAP 9 11/10/2019   No results found for: CHOL No results found for: HDL No results found for: LDLCALC No results found for: TRIG No results found for: CHOLHDL No results found for: YHAJ8R    Assessment & Plan:   Problem List Items Addressed This Visit  Respiratory   Acute bronchitis   Acute Bronchitis Cough mostly resolved post-treatment. Did not complete prednisone . Minimal sputum, dry cough, clear lung sounds. Shortness of breath possibly due to anxiety . Normal chest X-ray. -  Encourage hydration and use of over-the-counter cough medication like Mucinex if needed. - Advise completion of prescribed medications. - Consider referral to pulmonologist if shortness of breath persists.        Other   Anxiety and depression      01/29/2024    3:13 PM 09/21/2023   10:55 AM 09/21/2023   10:25 AM  Depression screen PHQ 2/9  Decreased Interest 0 1 2  Down, Depressed, Hopeless 0 1 3  PHQ - 2 Score 0 2 5  Altered sleeping 0 3 3  Tired, decreased energy 0 2 1  Change in appetite 0 1 0  Feeling bad or failure about yourself  0 0 0  Trouble concentrating 3 2 3   Moving slowly or fidgety/restless 0 1 1  Suicidal thoughts 0 0 0  PHQ-9 Score 3 11 13   Difficult doing work/chores Somewhat difficult Not difficult at all Very difficult       01/29/2024    3:14 PM 09/21/2023   10:29 AM 07/16/2023    3:58 PM 12/13/2022    3:42 PM  GAD 7 : Generalized Anxiety Score  Nervous, Anxious, on Edge 0 1 0 3  Control/stop worrying 0 1 0 3  Worry too much - different things 0 1 0 3  Trouble relaxing 0 1 0 3  Restless 0 0 0 0  Easily annoyed or irritable 0 2 0 3  Afraid - awful might happen 0 0 0 0  Total GAD 7 Score 0 6 0 15  Anxiety Difficulty Not difficult at all Somewhat difficult Not difficult at all Very difficult   Minimal use of hydroxyzine  indicates infrequent anxiety. Depression and anxiety screenings show well-controlled symptoms with minor concentration issues. - Continue hydroxyzine  as needed for anxiety.         Screen for STD (sexually transmitted disease) - Primary   Concern about oral thrush and requests for HIV/STD testing. No signs of thrush on examination.  - Order STD testing - Provide reassurance regarding oral health. - Encourage adequate hydration.      Relevant Orders   HepB+HepC+HIV Panel   RPR   NuSwab Vaginitis Plus (VG+)    No orders of the defined types were placed in this encounter.   Follow-up: No follow-ups on file.    Mamye Bolds R Tyrene Nader,  FNP

## 2024-01-29 NOTE — Assessment & Plan Note (Signed)
 Concern about oral thrush and requests for HIV/STD testing. No signs of thrush on examination.  - Order STD testing - Provide reassurance regarding oral health. - Encourage adequate hydration.

## 2024-01-29 NOTE — Assessment & Plan Note (Signed)
    01/29/2024    3:13 PM 09/21/2023   10:55 AM 09/21/2023   10:25 AM  Depression screen PHQ 2/9  Decreased Interest 0 1 2  Down, Depressed, Hopeless 0 1 3  PHQ - 2 Score 0 2 5  Altered sleeping 0 3 3  Tired, decreased energy 0 2 1  Change in appetite 0 1 0  Feeling bad or failure about yourself  0 0 0  Trouble concentrating 3 2 3   Moving slowly or fidgety/restless 0 1 1  Suicidal thoughts 0 0 0  PHQ-9 Score 3 11 13   Difficult doing work/chores Somewhat difficult Not difficult at all Very difficult       01/29/2024    3:14 PM 09/21/2023   10:29 AM 07/16/2023    3:58 PM 12/13/2022    3:42 PM  GAD 7 : Generalized Anxiety Score  Nervous, Anxious, on Edge 0 1 0 3  Control/stop worrying 0 1 0 3  Worry too much - different things 0 1 0 3  Trouble relaxing 0 1 0 3  Restless 0 0 0 0  Easily annoyed or irritable 0 2 0 3  Afraid - awful might happen 0 0 0 0  Total GAD 7 Score 0 6 0 15  Anxiety Difficulty Not difficult at all Somewhat difficult Not difficult at all Very difficult   Minimal use of hydroxyzine  indicates infrequent anxiety. Depression and anxiety screenings show well-controlled symptoms with minor concentration issues. - Continue hydroxyzine  as needed for anxiety.

## 2024-01-29 NOTE — Assessment & Plan Note (Signed)
 Acute Bronchitis Cough mostly resolved post-treatment. Did not complete prednisone . Minimal sputum, dry cough, clear lung sounds. Shortness of breath possibly due to anxiety . Normal chest X-ray. - Encourage hydration and use of over-the-counter cough medication like Mucinex if needed. - Advise completion of prescribed medications. - Consider referral to pulmonologist if shortness of breath persists.

## 2024-01-29 NOTE — Patient Instructions (Addendum)
 What is Oral Thrush? Oral thrush is an infection that occurs when Candida, a fungus usually present in the mouth and digestive tract, overgrows. This causes inflammation and white or yellow patches in various areas of the oral cavity, such as the inner cheeks, tongue, and sometimes the roof of the mouth, gums, and tonsils. These patches can be painful and make it difficult to swallow or eat. Symptoms of Oral Thrush A burning feeling in the mouth and throat. White patches that stick to the mouth and tongue. A bad taste in the mouth or trouble tasting foods. A feeling like you have cotton in your mouth. Pain when you eat and swallow. Not wanting to eat as much as usual. Cracking at the corners of the mouth.   Please monitor for these symptoms and report any of them   For cough please take over-the-counter cough medications as needed, drink at least 60 ounces of water daily to maintain hydration  It is important that you exercise regularly at least 30 minutes 5 times a week as tolerated  Think about what you will eat, plan ahead. Choose  clean, green, fresh or frozen over canned, processed or packaged foods which are more sugary, salty and fatty. 70 to 75% of food eaten should be vegetables and fruit. Three meals at set times with snacks allowed between meals, but they must be fruit or vegetables. Aim to eat over a 12 hour period , example 7 am to 7 pm, and STOP after  your last meal of the day. Drink water,generally about 64 ounces per day, no other drink is as healthy. Fruit juice is best enjoyed in a healthy way, by EATING the fruit.  Thanks for choosing [facility] we consider it a privelige to serve you.

## 2024-01-31 LAB — HEPB+HEPC+HIV PANEL
HIV Screen 4th Generation wRfx: NONREACTIVE
Hep B C IgM: NEGATIVE
Hep B Core Total Ab: NEGATIVE
Hep B E Ab: NONREACTIVE
Hep B E Ag: NEGATIVE
Hep B Surface Ab, Qual: REACTIVE
Hep C Virus Ab: NONREACTIVE
Hepatitis B Surface Ag: NEGATIVE

## 2024-01-31 LAB — NUSWAB VAGINITIS PLUS (VG+)
Candida albicans, NAA: NEGATIVE
Candida glabrata, NAA: NEGATIVE

## 2024-01-31 LAB — RPR: RPR Ser Ql: NONREACTIVE

## 2024-02-01 ENCOUNTER — Ambulatory Visit: Payer: Self-pay | Admitting: Nurse Practitioner

## 2024-02-01 DIAGNOSIS — Z419 Encounter for procedure for purposes other than remedying health state, unspecified: Secondary | ICD-10-CM | POA: Diagnosis not present

## 2024-03-02 DIAGNOSIS — Z419 Encounter for procedure for purposes other than remedying health state, unspecified: Secondary | ICD-10-CM | POA: Diagnosis not present

## 2024-03-22 ENCOUNTER — Other Ambulatory Visit: Payer: Self-pay

## 2024-03-22 ENCOUNTER — Ambulatory Visit
Admission: EM | Admit: 2024-03-22 | Discharge: 2024-03-22 | Disposition: A | Discharge status: Home / Self Care | Attending: Family Medicine | Admitting: Family Medicine

## 2024-03-22 DIAGNOSIS — Z113 Encounter for screening for infections with a predominantly sexual mode of transmission: Secondary | ICD-10-CM | POA: Insufficient documentation

## 2024-03-22 DIAGNOSIS — N898 Other specified noninflammatory disorders of vagina: Secondary | ICD-10-CM | POA: Diagnosis not present

## 2024-03-22 LAB — POCT URINE DIPSTICK
Bilirubin, UA: NEGATIVE
Blood, UA: NEGATIVE
Glucose, UA: NEGATIVE mg/dL
Ketones, POC UA: NEGATIVE mg/dL
Leukocytes, UA: NEGATIVE
Nitrite, UA: NEGATIVE
POC PROTEIN,UA: NEGATIVE
Spec Grav, UA: 1.025 (ref 1.010–1.025)
Urobilinogen, UA: 0.2 U/dL
pH, UA: 5.5 (ref 5.0–8.0)

## 2024-03-22 LAB — POCT URINE PREGNANCY: Preg Test, Ur: NEGATIVE

## 2024-03-22 MED ORDER — METRONIDAZOLE 0.75 % VA GEL: 1.0000 | Freq: Every day | VAGINAL | 0 refills | Status: AC

## 2024-03-22 NOTE — Discharge Instructions (Addendum)
 The clinic will contact you with results of the vaginal swab/STD testing done today if positive.  Start metronidazole  vaginal gel nightly for 5 nights.  Follow-up with your PCP or gynecologist if your symptoms do not improve.  Please go to the ER for any worsening symptoms.  Hope you feel better soon!

## 2024-03-22 NOTE — ED Triage Notes (Signed)
 Pt c/o white vaginal dischargexmo. Pt wants STI testing

## 2024-03-22 NOTE — ED Provider Notes (Signed)
 [facility]    CSN: [id number] Arrival date & time: 03/22/24  0906      History   Chief Complaint No chief complaint on file.   HPI Cheyenne Medina is a 23 y.o. female presents for vaginal discharge.  Patient reports 1 month of an intermittent malodorous vaginal discharge.  Denies dysuria, fevers, vomiting or flank pain.  No known STD exposure but would like screening while here.  Does have a history of BV and states she was treated in September but did not take the medication consistently.  She is been doing over-the-counter boric acid which helps but as soon as she stopped symptoms returned.  No other concerns at this time.  HPI  Past Medical History:  Diagnosis Date   Anemia of pregnancy 12/13/2022   Hgb 9.7 6/4, B12 low, Rx B12 1000mcg QD and recommend PO iron; for iron infusion 07-20.     Anxiety and depression 12/13/2022   Benign gestational thrombocytopenia 09/05/2023   142 in MAU; 130 in MAU on 07-12. Repeat at 36 weeks. 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   Benign gestational thrombocytopenia 09/05/2023   Chlamydial infection 09/05/2023   Annual physical exam 07/16/2023   Anemia of pregnancy 12/13/2022   Anxiety and depression 12/13/2022   Fatigue 12/13/2022   Normal labor and delivery 12/22/2019   S/P dilation and curettage 01/21/2019   Fetal anencephaly complicating pregnancy 01/15/2019   Pregnancy 11/12/2018   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 Authorizing Provider  metroNIDAZOLE  (METROGEL ) 0.75 % vaginal gel Place 1 Applicatorful vaginally at bedtime for 5 days. 03/22/24 03/27/24 Yes Shalah Estelle, Jodi R, NP  hydrOXYzine  (VISTARIL ) 25 MG capsule Take 1 capsule (25 mg total) by mouth every 8 (eight) hours as needed. 09/21/23   Paseda, Folashade R, FNP    Family History History reviewed. No pertinent family history.  Social History Social History   Tobacco Use   Smoking status: Never    Passive exposure: Never   Smokeless tobacco: Never  Vaping Use   Vaping status: Never Used  Substance Use Topics   Alcohol use: Yes    Comment: occasionally   Drug use: Never     Allergies   Patient has no known allergies.   Review of Systems Review of Systems  Genitourinary:  Positive for vaginal discharge.     Physical Exam Triage Vital Signs ED Triage Vitals  Encounter Vitals Group     BP 03/22/24 0915 122/79     Girls Systolic BP Percentile --  Girls Diastolic BP Percentile --      Boys Systolic BP Percentile --      Boys Diastolic BP Percentile --      Pulse Rate 03/22/24 0915 83     Resp 03/22/24 0915 16     Temp 03/22/24 0915 98.6 F (37 C)     Temp Source 03/22/24 0915 Oral     SpO2 03/22/24 0915 98 %     Weight --      Height --      Head Circumference --      Peak Flow --      Pain Score 03/22/24 0914 0     Pain Loc --      Pain Education --      Exclude from Growth Chart --    No data found.  Updated Vital Signs BP 122/79   Pulse 83   Temp 98.6 F (37 C) (Oral)   Resp 16   LMP 03/04/2024   SpO2 98%   Visual Acuity Right Eye Distance:   Left Eye Distance:   Bilateral Distance:    Right Eye Near:   Left Eye Near:    Bilateral Near:     Physical Exam Vitals and nursing note reviewed.  Constitutional:      Appearance: Normal  appearance.  HENT:     Head: Normocephalic and atraumatic.  Eyes:     Pupils: Pupils are equal, round, and reactive to light.  Cardiovascular:     Rate and Rhythm: Normal rate.  Pulmonary:     Effort: Pulmonary effort is normal.  Skin:    General: Skin is warm and dry.  Neurological:     General: No focal deficit present.     Mental Status: She is alert and oriented to person, place, and time.  Psychiatric:        Mood and Affect: Mood normal.        Behavior: Behavior normal.      UC Treatments / Results  Labs (all labs ordered are listed, but only abnormal results are displayed) Labs Reviewed  RPR  HIV ANTIBODY (ROUTINE TESTING W REFLEX)  POCT URINE PREGNANCY  POCT URINE DIPSTICK  CERVICOVAGINAL ANCILLARY ONLY    EKG   Radiology No results found.  Procedures Procedures (including critical care time)  Medications Ordered in UC Medications - No data to display  Initial Impression / Assessment and Plan / UC Course  I have reviewed the triage vital signs and the nursing notes.  Pertinent labs & imaging results that were available during my care of the patient were reviewed by me and considered in my medical decision making (see chart for details).     Vaginal swab/STD testing as ordered and will contact for any positive results.  UA and urine hCG negative.  Patient requested vaginal inserts, will do metronidazole  vaginal gel.  Advised PCP or GYN follow-up if symptoms do not improve.  ER precautions reviewed Final Clinical Impressions(s) / UC Diagnoses   Final diagnoses:  Vaginal discharge  Screen for STD (sexually transmitted disease)     Discharge Instructions      The clinic will contact you with results of the vaginal swab/STD testing done today if positive.  Start metronidazole  vaginal gel nightly for 5 nights.  Follow-up with your PCP or gynecologist if your symptoms do not improve.  Please go to the ER for any worsening symptoms.  Hope you feel  better soon!    ED Prescriptions  Medication Sig Dispense Auth. Provider   metroNIDAZOLE  (METROGEL ) 0.75 % vaginal gel Place 1 Applicatorful vaginally at bedtime for 5 days. 70 g Adamarys Shall, Jodi R, NP      PDMP not reviewed this encounter.   [name], NP 03/22/24 [phone]

## 2024-03-23 LAB — HIV ANTIBODY (ROUTINE TESTING W REFLEX): HIV Screen 4th Generation wRfx: NONREACTIVE

## 2024-03-23 LAB — RPR: RPR Ser Ql: NONREACTIVE

## 2024-03-24 ENCOUNTER — Ambulatory Visit (HOSPITAL_COMMUNITY): Payer: Self-pay

## 2024-03-24 LAB — CERVICOVAGINAL ANCILLARY ONLY
Bacterial Vaginitis (gardnerella): POSITIVE — AB
Candida Glabrata: NEGATIVE
Candida Vaginitis: NEGATIVE
Chlamydia: NEGATIVE
Comment: NEGATIVE
Comment: NEGATIVE
Comment: NEGATIVE
Comment: NEGATIVE
Comment: NEGATIVE
Comment: NORMAL
Neisseria Gonorrhea: NEGATIVE
Trichomonas: NEGATIVE

## 2024-04-01 ENCOUNTER — Other Ambulatory Visit: Payer: Self-pay

## 2024-04-01 ENCOUNTER — Emergency Department (HOSPITAL_COMMUNITY)
Admission: EM | Admit: 2024-04-01 | Discharge: 2024-04-01 | Disposition: A | Discharge status: Home / Self Care | Attending: Emergency Medicine | Admitting: Emergency Medicine

## 2024-04-01 ENCOUNTER — Encounter (HOSPITAL_COMMUNITY): Payer: Self-pay

## 2024-04-01 DIAGNOSIS — U071 COVID-19: Secondary | ICD-10-CM | POA: Diagnosis not present

## 2024-04-01 DIAGNOSIS — R509 Fever, unspecified: Secondary | ICD-10-CM | POA: Diagnosis present

## 2024-04-01 LAB — URINALYSIS, W/ REFLEX TO CULTURE (INFECTION SUSPECTED)
Bacteria, UA: NONE SEEN
Bilirubin Urine: NEGATIVE
Glucose, UA: NEGATIVE mg/dL
Hgb urine dipstick: NEGATIVE
Ketones, ur: NEGATIVE mg/dL
Leukocytes,Ua: NEGATIVE
Nitrite: NEGATIVE
Protein, ur: NEGATIVE mg/dL
Specific Gravity, Urine: 1.02 (ref 1.005–1.030)
pH: 6 (ref 5.0–8.0)

## 2024-04-01 LAB — RESP PANEL BY RT-PCR (RSV, FLU A&B, COVID)  RVPGX2
Influenza A by PCR: NEGATIVE
Influenza B by PCR: NEGATIVE
Resp Syncytial Virus by PCR: NEGATIVE
SARS Coronavirus 2 by RT PCR: POSITIVE — AB

## 2024-04-01 LAB — PREGNANCY, URINE: Preg Test, Ur: NEGATIVE

## 2024-04-01 MED ORDER — ACETAMINOPHEN 500 MG PO TABS
1000.0000 mg | ORAL_TABLET | Freq: Once | ORAL | Status: AC
  Administered 2024-04-01: 1000 mg via ORAL
  Filled 2024-04-01: qty 2

## 2024-04-01 MED ORDER — IBUPROFEN 200 MG PO TABS
600.0000 mg | ORAL_TABLET | Freq: Once | ORAL | Status: AC
  Administered 2024-04-01: 600 mg via ORAL
  Filled 2024-04-01: qty 3

## 2024-04-01 NOTE — Discharge Instructions (Signed)
 Return for any problem.  ?

## 2024-04-01 NOTE — ED Provider Notes (Signed)
 [place] [facility] AT [facility] Provider Note   CSN: [id number] Arrival date & time: 04/01/24  9061     Patient presents with: Fever   Cheyenne Medina is a 23 y.o. female.   23 year old female with prior medical history as detailed below presents for evaluation.  Patient reports that around 1 AM she began to have fever and chills.  She also reports mild congestion.  She denies sore throat.  She denies chest pain or shortness of breath or cough.  She denies abdominal pain.  She denies any urinary symptoms.  She took some Tylenol  around 230.  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  She reports multiple COVID exposures from patients at work.  She is requesting a COVID test here.  She reports that her home COVID test earlier this morning was negative.  The history is provided by the patient and medical records.       Prior to Admission medications   Medication Sig Start Date End Date Taking? Authorizing Provider  hydrOXYzine  (VISTARIL ) 25 MG capsule Take 1 capsule (25 mg total) by mouth every 8 (eight) hours as needed. 09/21/23   Paseda, Folashade R, FNP    Allergies: Patient has no known allergies.    Review of Systems  All other systems reviewed and are negative.   Updated Vital Signs BP 121/82 (BP Location: Left Arm)   Pulse 90   Temp (!) 101.6 F (38.7 C) (Oral)   Resp 16   LMP 02/28/2024 (Exact Date)   SpO2 100%   Physical Exam Vitals and nursing note reviewed.  Constitutional:      General: She is not in acute distress.    Appearance: She is well-developed.  HENT:     Head: Normocephalic and atraumatic.  Eyes:     Conjunctiva/sclera: Conjunctivae normal.  Cardiovascular:     Rate and Rhythm: Normal rate and regular rhythm.     Heart sounds: No murmur heard. Pulmonary:     Effort: Pulmonary effort is normal. No respiratory distress.     Breath sounds: Normal breath sounds.  Abdominal:     Palpations: Abdomen is soft.      Tenderness: There is no abdominal tenderness.  Musculoskeletal:        General: No swelling.     Cervical back: Neck supple.  Skin:    General: Skin is warm and dry.     Capillary Refill: Capillary refill takes less than 2 seconds.  Neurological:     Mental Status: She is alert.  Psychiatric:        Mood and Affect: Mood normal.     (all labs ordered are listed, but only abnormal results are displayed) Labs Reviewed  RESP PANEL BY RT-PCR (RSV, FLU A&B, COVID)  [id number]  URINALYSIS, W/ REFLEX TO CULTURE (INFECTION SUSPECTED)  PREGNANCY, URINE    EKG: None  Radiology: No results found.   Procedures   Medications Ordered in the ED  acetaminophen  (TYLENOL ) tablet 1,000 mg (has no administration in time range)  ibuprofen  (ADVIL ) tablet 600 mg (has no administration in time range)                                    Medical Decision Making Patient with onset of viral syndrome approximately 9 hours prior to arrival.  Patient works around patients.  She is requesting a COVID test.  Patient's COVID test is  positive.  Her symptoms are secondary to acute COVID infection.  She feels better with Tylenol  and ibuprofen .    She desires discharge home.  Importance of close follow-up stressed.  Strict return precautions given and understood.  Amount and/or Complexity of Data Reviewed Labs: ordered.  Risk OTC drugs.        Final diagnoses:  COVID    ED Discharge Orders     None          Laurice Maude BROCKS, MD 04/01/24 1132

## 2024-04-01 NOTE — ED Triage Notes (Signed)
 Pt ambulatory to triage with complaints of chills that began at approx 1 AM. PT states that she took tylenol , and her sx improved but have now returned. 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 Home covid test was negative.   02:30 last Tylenol 

## 2024-06-24 ENCOUNTER — Ambulatory Visit
Admission: EM | Admit: 2024-06-24 | Discharge: 2024-06-24 | Disposition: A | Discharge status: Home / Self Care | Source: Ambulatory Visit

## 2024-06-24 ENCOUNTER — Ambulatory Visit
Admission: EM | Admit: 2024-06-24 | Discharge: 2024-06-24 | Discharge status: Against Medical Advice | Source: Home / Self Care

## 2024-06-24 DIAGNOSIS — Z113 Encounter for screening for infections with a predominantly sexual mode of transmission: Secondary | ICD-10-CM | POA: Diagnosis not present

## 2024-06-24 LAB — POCT URINE PREGNANCY: Preg Test, Ur: NEGATIVE

## 2024-06-24 NOTE — ED Provider Notes (Signed)
 " [facility]    CSN: [id number] Arrival date & time: 06/24/24  1515      History   Chief Complaint Chief Complaint  Patient presents with   Exposure to STD    HPI Cheyenne Medina is a 24 y.o. female who presents for STD testing.  Patient currently denies any symptoms including dysuria, vaginal discharge, fevers, flank pain.  No known STD exposure.  No other concerns.   Exposure to STD    Past Medical History:  Diagnosis Date   Anemia of pregnancy 12/13/2022   Hgb 9.7 6/4, B12 low, Rx B12 1000mcg QD and recommend PO iron; for iron infusion 07-20.     Anxiety and depression 12/13/2022   Benign gestational thrombocytopenia 09/05/2023   142 in MAU; 130 in MAU on 07-12. Repeat at 36 weeks. 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   Benign gestational thrombocytopenia 09/05/2023   Chlamydial infection 09/05/2023   Annual physical exam 07/16/2023   Anemia of pregnancy 12/13/2022   Anxiety and depression 12/13/2022   Fatigue 12/13/2022   Normal labor and delivery 12/22/2019   S/P dilation and curettage 01/21/2019   Fetal anencephaly complicating pregnancy 01/15/2019   Pregnancy 11/12/2018   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 Authorizing Provider  hydrOXYzine  (VISTARIL ) 25 MG capsule Take 1 capsule (25 mg  total) by mouth every 8 (eight) hours as needed. 09/21/23  Yes Medina, Cheyenne R, FNP    Family History History reviewed. No pertinent family history.  Social History Social History[1]   Allergies   Patient has no known allergies.   Review of Systems Review of Systems  Genitourinary:        STD screening     Physical Exam Triage Vital Signs ED Triage Vitals  Encounter Vitals Group     BP 06/24/24 1528 126/83     Girls Systolic BP Percentile --      Girls Diastolic BP Percentile --      Boys Systolic BP Percentile --      Boys Diastolic BP Percentile --      Pulse Rate 06/24/24 1528 66     Resp 06/24/24 1528 16     Temp 06/24/24 1528 98.1 F (36.7 C)     Temp Source 06/24/24 1528 Oral     SpO2 06/24/24 1528 99 %     Weight --      Height --      Head Circumference --      Peak Flow --  Pain Score 06/24/24 1526 0     Pain Loc --      Pain Education --      Exclude from Growth Chart --    No data found.  Updated Vital Signs BP 126/83 (BP Location: Left Arm)   Pulse 66   Temp 98.1 F (36.7 C) (Oral)   Resp 16   LMP 06/14/2024 (Exact Date)   SpO2 99%   Visual Acuity Right Eye Distance:   Left Eye Distance:   Bilateral Distance:    Right Eye Near:   Left Eye Near:    Bilateral Near:     Physical Exam Vitals and nursing note reviewed.  Constitutional:      Appearance: Normal appearance.  HENT:     Head: Normocephalic and atraumatic.  Eyes:     Pupils: Pupils are equal, round, and reactive to light.  Cardiovascular:     Rate and Rhythm: Normal rate.  Pulmonary:     Effort: Pulmonary effort is normal.  Skin:    General: Skin is warm and dry.  Neurological:     General: No focal deficit present.     Mental Status: She is alert and oriented to person, place, and time.  Psychiatric:        Mood and Affect: Mood normal.        Behavior: Behavior normal.      UC Treatments / Results  Labs (all labs ordered are listed, but only abnormal  results are displayed) Labs Reviewed  SYPHILIS: RPR W/REFLEX TO RPR TITER AND TREPONEMAL ANTIBODIES, TRADITIONAL SCREENING AND DIAGNOSIS ALGORITHM  HIV ANTIBODY (ROUTINE TESTING W REFLEX)  POCT URINE PREGNANCY  CERVICOVAGINAL ANCILLARY ONLY    EKG   Radiology No results found.  Procedures Procedures (including critical care time)  Medications Ordered in UC Medications - No data to display  Initial Impression / Assessment and Plan / UC Course  I have reviewed the triage vital signs and the nursing notes.  Pertinent labs & imaging results that were available during my care of the patient were reviewed by me and considered in my medical decision making (see chart for details).     STD testing as ordered and will contact for any positive results.  Patient to follow-up with PCP as needed. Final Clinical Impressions(s) / UC Diagnoses   Final diagnoses:  Screening examination for STD (sexually transmitted disease)     Discharge Instructions      The clinic will contact you with results of the STD testing done today if positive.  Follow-up with your PCP as needed.   ED Prescriptions   None    PDMP not reviewed this encounter.    [1]  Social History Tobacco Use   Smoking status: Never    Passive exposure: Never   Smokeless tobacco: Never  Vaping Use   Vaping status: Never Used  Substance Use Topics   Alcohol use: Yes    Comment: occasionally   Drug use: Never     Cheyenne Myla SAUNDERS, NP 06/24/24 1542  "

## 2024-06-24 NOTE — Discharge Instructions (Addendum)
 The clinic will contact you with results of the STD testing done today if positive.  Follow-up with your PCP as needed.

## 2024-06-25 LAB — HIV ANTIBODY (ROUTINE TESTING W REFLEX): HIV Screen 4th Generation wRfx: NONREACTIVE

## 2024-06-25 LAB — SYPHILIS: RPR W/REFLEX TO RPR TITER AND TREPONEMAL ANTIBODIES, TRADITIONAL SCREENING AND DIAGNOSIS ALGORITHM: RPR Ser Ql: NONREACTIVE

## 2024-06-25 LAB — CERVICOVAGINAL ANCILLARY ONLY
Chlamydia: NEGATIVE
Comment: NEGATIVE
Comment: NEGATIVE
Comment: NORMAL
Neisseria Gonorrhea: NEGATIVE
Trichomonas: NEGATIVE

## 2024-07-15 ENCOUNTER — Ambulatory Visit: Payer: Self-pay | Admitting: Nurse Practitioner
